# Patient Record
Sex: Female | Born: 1990
Health system: Southern US, Community
[De-identification: ages and names within clinical notes are randomized; demographics above are authoritative.]

## PROBLEM LIST (undated history)

## (undated) DIAGNOSIS — G43909 Migraine, unspecified, not intractable, without status migrainosus: Secondary | ICD-10-CM

## (undated) HISTORY — DX: Migraine, unspecified, not intractable, without status migrainosus: G43.909

## (undated) HISTORY — PX: OTHER SURGICAL HISTORY: SHX169

---

## 2015-10-17 ENCOUNTER — Encounter (HOSPITAL_COMMUNITY): Payer: Self-pay | Admitting: *Deleted

## 2015-10-17 ENCOUNTER — Inpatient Hospital Stay (HOSPITAL_COMMUNITY)
Admission: AD | Admit: 2015-10-17 | Discharge: 2015-10-18 | Disposition: A | Discharge status: Home / Self Care | Payer: Self-pay | Source: Ambulatory Visit | Attending: Obstetrics & Gynecology | Admitting: Obstetrics & Gynecology

## 2015-10-17 DIAGNOSIS — K610 Anal abscess: Secondary | ICD-10-CM | POA: Insufficient documentation

## 2015-10-17 DIAGNOSIS — R300 Dysuria: Secondary | ICD-10-CM | POA: Insufficient documentation

## 2015-10-17 DIAGNOSIS — R102 Pelvic and perineal pain: Secondary | ICD-10-CM | POA: Insufficient documentation

## 2015-10-17 DIAGNOSIS — L0291 Cutaneous abscess, unspecified: Secondary | ICD-10-CM

## 2015-10-17 LAB — POCT PREGNANCY, URINE: Preg Test, Ur: NEGATIVE

## 2015-10-17 LAB — WET PREP, GENITAL
Clue Cells Wet Prep HPF POC: NONE SEEN
Sperm: NONE SEEN
Trich, Wet Prep: NONE SEEN
Yeast Wet Prep HPF POC: NONE SEEN

## 2015-10-17 MED ORDER — LIDOCAINE HCL 2 % EX GEL
1.0000 "application " | Freq: Once | CUTANEOUS | Status: AC
  Administered 2015-10-17: 1 via TOPICAL
  Filled 2015-10-17: qty 5

## 2015-10-17 NOTE — MAU Note (Cosign Needed)
PT SAYS  GOT LAST DEPO SHOT IN 07-2015-     AT [facility]  .   PT  MOVED HERE IN 06-2015.      SAYS SHE HAS A  BOIL ON PERINEUM - NOTICED YESTERDAY- HURTS TO SIT   .    LAST SEX-    3 WEEKS AGO.

## 2015-10-17 NOTE — MAU Provider Note (Signed)
History     CSN: [id number]  Arrival date and time: 10/17/15 2214   First Provider Initiated Contact with Patient 10/17/15 2312      Chief Complaint  Patient presents with  . Bartholin's Cyst   Pelvic Pain The patient's primary symptoms include genital lesions and pelvic pain. The current episode started yesterday. The problem occurs constantly. The problem has been gradually improving (patient states that she feels the cyst is starting to drain. ). Pain severity now: 6/10  The problem affects the left side. She is not pregnant. Associated symptoms include dysuria. Pertinent negatives include no abdominal pain, chills, constipation, diarrhea, fever, frequency, nausea, urgency or vomiting. The symptoms are aggravated by activity. She has tried nothing for the symptoms. She is sexually active. No, her partner does not have an STD. She uses progestin injections (last depo injection in May. Due in July. ) for contraception. Her menstrual history has been irregular (unsure of last date, very seldom period with depo. ).    History reviewed. No pertinent past medical history.  History reviewed. No pertinent past surgical history.  History reviewed. No pertinent family history.  Social History  Substance Use Topics  . Smoking status: Never Smoker   . Smokeless tobacco: None  . Alcohol Use: No    Allergies: Allergies not on file  No prescriptions prior to admission    Review of Systems  Constitutional: Negative for fever and chills.  Gastrointestinal: Negative for nausea, vomiting, abdominal pain, diarrhea and constipation.  Genitourinary: Positive for dysuria and pelvic pain. Negative for urgency and frequency.   Physical Exam   Blood pressure 104/61, pulse 128, temperature 99.5 F (37.5 C), temperature source Oral, resp. rate 18, height  (1.575 m), weight 59.648 kg (131 lb 8 oz).  Physical Exam  Nursing note and vitals reviewed. Constitutional: She is oriented to person,  place, and time. She appears well-developed and well-nourished. No distress.  HENT:  Head: Normocephalic.  Cardiovascular: Normal rate.   Respiratory: Effort normal.  GI: Soft. There is no tenderness. There is no rebound.  Genitourinary:  2cmx2cm perianal abscess. Abscess is open and draining at this time.  Pelvic exam very uncomfortable for the patient. Wet prep and GC/CT swabs collected with blind sweep.   Neurological: She is alert and oriented to person, place, and time.  Skin: Skin is warm and dry.  Psychiatric: She has a normal mood and affect.   Results for orders placed or performed during the hospital encounter of 10/17/15 (from the past 24 hour(s))  Wet prep, genital     Status: Abnormal   Collection Time: 10/17/15 11:24 PM  Result Value Ref Range   Yeast Wet Prep HPF POC NONE SEEN NONE SEEN   Trich, Wet Prep NONE SEEN NONE SEEN   Clue Cells Wet Prep HPF POC NONE SEEN NONE SEEN   WBC, Wet Prep HPF POC MODERATE (A) NONE SEEN   Sperm NONE SEEN   Pregnancy, urine POC     Status: None   Collection Time: 10/17/15 11:48 PM  Result Value Ref Range   Preg Test, Ur NEGATIVE NEGATIVE    MAU Course  Procedures  MDM   Assessment and Plan   1. Abscess    DC home Comfort measures reviewed  RX: none  Return to MAU as needed   Follow-up Information    Follow up with [facility].   Why:  If symptoms worsen   Contact information:   [address] [employer]  [place] [id number] [phone]         Tawnya CrookHogan, Heather Donovan 10/17/2015, 11:14 PM

## 2015-10-18 LAB — URINE MICROSCOPIC-ADD ON: RBC / HPF: NONE SEEN RBC/hpf (ref 0–5)

## 2015-10-18 LAB — URINALYSIS, ROUTINE W REFLEX MICROSCOPIC
Bilirubin Urine: NEGATIVE
Glucose, UA: NEGATIVE mg/dL
Ketones, ur: NEGATIVE mg/dL
Leukocytes, UA: NEGATIVE
Nitrite: NEGATIVE
Protein, ur: NEGATIVE mg/dL
Specific Gravity, Urine: 1.025 (ref 1.005–1.030)
pH: 5.5 (ref 5.0–8.0)

## 2015-10-18 LAB — GC/CHLAMYDIA PROBE AMP (~~LOC~~) NOT AT ARMC
Chlamydia: NEGATIVE
Neisseria Gonorrhea: NEGATIVE

## 2015-10-18 NOTE — Discharge Instructions (Signed)

## 2016-06-05 LAB — HM PAP SMEAR: HM Pap smear: NORMAL

## 2017-05-09 ENCOUNTER — Ambulatory Visit (INDEPENDENT_AMBULATORY_CARE_PROVIDER_SITE_OTHER): Payer: BLUE CROSS/BLUE SHIELD | Admitting: Family Medicine

## 2017-05-09 ENCOUNTER — Encounter: Payer: Self-pay | Admitting: Family Medicine

## 2017-05-09 ENCOUNTER — Other Ambulatory Visit (HOSPITAL_COMMUNITY)
Admission: RE | Admit: 2017-05-09 | Discharge: 2017-05-09 | Disposition: A | Discharge status: Home / Self Care | Payer: BLUE CROSS/BLUE SHIELD | Source: Ambulatory Visit | Attending: Family Medicine | Admitting: Family Medicine

## 2017-05-09 VITALS — BP 98/60 | HR 76 | Temp 98.3°F | Ht 62.75 in | Wt 130.3 lb

## 2017-05-09 DIAGNOSIS — N898 Other specified noninflammatory disorders of vagina: Secondary | ICD-10-CM | POA: Diagnosis not present

## 2017-05-09 DIAGNOSIS — A5602 Chlamydial vulvovaginitis: Secondary | ICD-10-CM | POA: Diagnosis not present

## 2017-05-09 DIAGNOSIS — Z7689 Persons encountering health services in other specified circumstances: Secondary | ICD-10-CM

## 2017-05-09 DIAGNOSIS — B9689 Other specified bacterial agents as the cause of diseases classified elsewhere: Secondary | ICD-10-CM | POA: Insufficient documentation

## 2017-05-09 LAB — POC URINALSYSI DIPSTICK (AUTOMATED)
Blood, UA: NEGATIVE
Glucose, UA: NEGATIVE
Ketones, UA: NEGATIVE
Leukocytes, UA: NEGATIVE
Nitrite, UA: NEGATIVE
Spec Grav, UA: >=1.03 — AB (ref 1.010–1.025)
Urobilinogen, UA: 0.2 E.U./dL
pH, UA: 6 (ref 5.0–8.0)

## 2017-05-09 MED ORDER — METRONIDAZOLE 500 MG PO TABS
500.0000 mg | ORAL_TABLET | Freq: Two times a day (BID) | ORAL | 0 refills | Status: AC
Start: 2017-05-09 — End: 2017-05-16

## 2017-05-09 MED ORDER — FLUCONAZOLE 150 MG PO TABS: 150.0000 mg | ORAL_TABLET | Freq: Once | ORAL | 0 refills | Status: AC

## 2017-05-09 NOTE — Progress Notes (Signed)
Patient presents to clinic today for acute concern and establish care.  SUBJECTIVE: PMH: Pt is a 27 yo with no sig pmh.  She was previously seen by Dr. Tresa ResValerie [place].  [place] the [facility] [place].  Patient reports a history of abnormal Pap, but cannot recall what was abnormal about them. Pt is on Depo.  Next injection due March 2019.   Recurrent yeast infection: -Started a few months ago, happens off and on -Latest episode occurred last week. -Patient endorses itching, irritation, whitish discharge.  Denies burning. -Patient is sexually active with one female partner, her BF.  They are not using condoms.  Is on Depo-Provera -Patient has tried Monistat and Azo.  She reports relief of symptoms today. -Patient does not drink water.  States has been drinking cranberry juice.  Allergies: NKDA  Past surgical history: None  Social history: Patient is currently single.  She works as a Engineer, civil (consulting)nurse at [employer].  Going back to college perhaps at [employer] for a BS in program.  Patient denies tobacco, drug use.  Patient endorses social alcohol use.  LMP September 2018.  Patient is on Depakote.  Family medical history: Mom- alive Sister-alive, hearing loss.   History reviewed. No pertinent past medical history.  History reviewed. No pertinent surgical history.  No current outpatient medications on file prior to visit.   No current facility-administered medications on file prior to visit.     No Known Allergies  History reviewed. No pertinent family history.  Social History   Socioeconomic History  . Marital status: Single    Spouse name: Not on file  . Number of children: Not on file  . Years of education: Not on file  . Highest education level: Not on file  Social Needs  . Financial resource strain: Not on file  . Food insecurity - worry: Not on file  . Food insecurity - inability: Not on file  . Transportation  needs - medical: Not on file  . Transportation needs - non-medical: Not on file  Occupational History  . Not on file  Tobacco Use  . Smoking status: Never Smoker  . Smokeless tobacco: Never Used  Substance and Sexual Activity  . Alcohol use: Yes    Comment: ocass.   . Drug use: No  . Sexual activity: Yes    Birth control/protection: Injection  Other Topics Concern  . Not on file  Social History Narrative  . Not on file    ROS General: Denies fever, chills, night sweats, changes in weight, changes in appetite HEENT: Denies headaches, ear pain, changes in vision, rhinorrhea, sore throat CV: Denies CP, palpitations, SOB, orthopnea Pulm: Denies SOB, cough, wheezing GI: Denies abdominal pain, nausea, vomiting, diarrhea, constipation GU: Denies dysuria, hematuria, frequency,    +vaginal discharge, itching, irritation. Msk: Denies muscle cramps, joint pains Neuro: Denies weakness, numbness, tingling Skin: Denies rashes, bruising Psych: Denies depression, anxiety, hallucinations  BP 98/60 (BP Location: Left Arm, Patient Position: Sitting, Cuff Size: Normal)   Pulse 76   Temp 98.3 F (36.8 C) (Oral)   Ht 5' 2.75" (1.594 m)   Wt 130 lb 4.8 oz (59.1 kg)   BMI 23.27 kg/m   Physical Exam Gen. Pleasant, well developed, well-nourished, in NAD HEENT - [place]/AT, PERRL, no scleral icterus, no nasal drainage. Lungs: no use of accessory muscles, CTAB, no wheezes, rales or rhonchi Cardiovascular: RRR, No r/g/m, no peripheral edema Abdomen: BS present, soft, nontender,nondistended,  no hepatosplenomegaly Musculoskeletal: No deformities, moves all four extremities, no cyanosis or clubbing, normal tone Neuro:  A&Ox3, CN II-XII intact, normal gait Skin:  Warm, dry, intact, no lesions Psych: normal affect, mood appropriate WU:[id number] external female genitalia, no lesions noted.  Thin clear white d/c  No results found for this or any previous visit (from the past 2160  hour(s)).  Assessment/Plan: Vaginal irritation  -Discussed hygiene -Discussed increasing p.o. intake of water -Patient given handout on vaginitis -We will preemptively treat for BV and yeast while awaiting lab results. -UA reviewed, SG elevated, trace protein - Plan: POCT Urinalysis Dipstick (Automated), Urine Culture, Cervicovaginal ancillary only, fluconazole (DIFLUCAN) 150 MG tablet, metroNIDAZOLE (FLAGYL) 500 MG tablet  Encounter to establish care -We reviewed the PMH, PSH, FH, SH, Meds and Allergies. -We provided refills for any medications we will prescribe as needed. -We addressed current concerns per orders and patient instructions. -We have asked for records for pertinent exams, studies, vaccines and notes from previous providers. -We have advised patient to follow up per instructions below.   Follow-up in approximately 1 month for pap.  Follow-up in March 2019 for Depo.   Abbe Amsterdam, MD

## 2017-05-09 NOTE — Patient Instructions (Addendum)
Your homework is to drink at water.  Your goal is four of the 16.9 oz bottles per day.   Vaginitis Vaginitis is an inflammation of the vagina. It can happen when the normal bacteria and yeast in the vagina grow too much. There are different types. Treatment will depend on the type you have. Follow these instructions at home:  Take all medicines as told by your doctor.  Keep your vagina area clean and dry. Avoid soap. Rinse the area with water.  Avoid washing and cleaning out the vagina (douching).  Do not use tampons or have sex (intercourse) until your treatment is done.  Wipe from front to back after going to the restroom.  Wear cotton underwear.  Avoid wearing underwear while you sleep until your vaginitis is gone.  Avoid tight pants. Avoid underwear or nylons without a cotton panel.  Take off wet clothing (such as a bathing suit) as soon as you can.  Use mild, unscented products. Avoid fabric softeners and scented: ? Feminine sprays. ? Laundry detergents. ? Tampons. ? Soaps or bubble baths.  Practice safe sex and use condoms. Get help right away if:  You have belly (abdominal) pain.  You have a fever or lasting symptoms for more than 2-3 days.  You have a fever and your symptoms suddenly get worse. This information is not intended to replace advice given to you by your health care provider. Make sure you discuss any questions you have with your health care provider. Document Released: 07/19/2008 Document Revised: 09/28/2015 Document Reviewed: 10/03/2011 Elsevier Interactive Patient Education  2017 [employer]

## 2017-05-10 LAB — URINE CULTURE
MICRO NUMBER:: 90015207
SPECIMEN QUALITY:: ADEQUATE

## 2017-05-12 LAB — CERVICOVAGINAL ANCILLARY ONLY
Bacterial vaginitis: POSITIVE — AB
Candida vaginitis: NEGATIVE
Chlamydia: POSITIVE — AB
Neisseria Gonorrhea: NEGATIVE
Trichomonas: NEGATIVE

## 2017-05-14 ENCOUNTER — Telehealth: Payer: Self-pay | Admitting: Family Medicine

## 2017-05-14 ENCOUNTER — Other Ambulatory Visit: Payer: Self-pay | Admitting: Family Medicine

## 2017-05-14 DIAGNOSIS — A749 Chlamydial infection, unspecified: Secondary | ICD-10-CM

## 2017-05-14 MED ORDER — AZITHROMYCIN 500 MG PO TABS: 1000.0000 mg | ORAL_TABLET | Freq: Once | ORAL | 0 refills | Status: AC

## 2017-05-14 NOTE — Telephone Encounter (Signed)
Spoke with patient regarding lab results and further questions she had. Nothing further needed.

## 2017-05-14 NOTE — Telephone Encounter (Signed)
Result note from Dr. Salomon FickBanks read to patient. Result [facility] to [facility].

## 2017-05-14 NOTE — Telephone Encounter (Signed)
Copied from CRM [phone]. Topic: Quick Communication - See Telephone Encounter >> May 14, 2017 10:35 AM Clack, Princella PellegriniJessica D wrote: CRM for notification. See Telephone encounter for: Pt would like Banks to give her a call, states that she has some questions about her labs.  She did not want to give me the question, would just like a call back.  05/14/17.

## 2017-05-14 NOTE — Progress Notes (Signed)
Azithromycin 1 gram, 0 refills sent to pharmacy for chlamydia infection.

## 2017-05-21 ENCOUNTER — Telehealth: Payer: Self-pay | Admitting: Emergency Medicine

## 2017-05-21 NOTE — Telephone Encounter (Signed)
Spoke with patient and she states that she is still having vaginal irritation, no odor, slight discharge. Patient did finish medication that was prescribed. Patient just wants to make sure that everything is okay. Patient has been scheduled to come in and se PCP 05/23/2017 at 9:30am. Nothing further needed.

## 2017-05-23 ENCOUNTER — Encounter: Payer: Self-pay | Admitting: Family Medicine

## 2017-05-23 ENCOUNTER — Ambulatory Visit: Payer: BLUE CROSS/BLUE SHIELD | Admitting: Family Medicine

## 2017-05-23 VITALS — BP 104/70 | HR 74 | Temp 98.2°F | Wt 130.2 lb

## 2017-05-23 DIAGNOSIS — A749 Chlamydial infection, unspecified: Secondary | ICD-10-CM | POA: Diagnosis not present

## 2017-05-23 NOTE — Progress Notes (Signed)
Subjective:    Patient ID: Kathy Cain, female    DOB: 06-Dec-1990, 27 y.o.   MRN: [id number]  No chief complaint on file.   HPI Patient was seen today for f/u on recent labs.  Pt tested positive for Chlamydia.  She completed her Azithromycin 1 g.  Pt endorses some continued d/c but not as much as before and occasional irritation.  Pt states she told her parnter, who has also been treated.  Pt states she was with him on and off for 10 yrs, but thinks she just wants time to herself given the recent events.  No past medical history on file.  No Known Allergies  ROS General: Denies fever, chills, night sweats, changes in weight, changes in appetite HEENT: Denies headaches, ear pain, changes in vision, rhinorrhea, sore throat CV: Denies CP, palpitations, SOB, orthopnea Pulm: Denies SOB, cough, wheezing GI: Denies abdominal pain, nausea, vomiting, diarrhea, constipation GU: Denies dysuria, hematuria, frequency, vaginal discharge and mild irritation Msk: Denies muscle cramps, joint pains Neuro: Denies weakness, numbness, tingling Skin: Denies rashes, bruising Psych: Denies depression, anxiety, hallucinations     Objective:    Blood pressure 104/70, pulse 74, temperature 98.2 F (36.8 C), temperature source Oral, weight 130 lb 3.2 oz (59.1 kg).   Gen. Pleasant, well-nourished, in no distress, normal affect   HEENT: [place]/AT, face symmetric, conjunctiva clear, no scleral icterus, PERRLA, nares patent without drainage Lungs: no accessory muscle use, CTAB, no wheezes or rales Cardiovascular: RRR, no m/r/g, no peripheral edema Abdomen: BS present, soft, NT/ND Neuro:  A&Ox3, CN II-XII intact, normal gait   Wt Readings from Last 3 Encounters:  05/23/17 130 lb 3.2 oz (59.1 kg)  05/09/17 130 lb 4.8 oz (59.1 kg)  10/17/15 131 lb 8 oz (59.6 kg)    No results found for: WBC, HGB, HCT, PLT, GLUCOSE, CHOL, TRIG, HDL, LDLDIRECT, LDLCALC, ALT, AST, NA, K, CL, CREATININE, BUN, CO2, TSH, PSA, INR,  GLUF, HGBA1C, MICROALBUR  Assessment/Plan:  Chlamydia -pt treated on 05/14/17 with Azithromycin 1 G. -pt advised to safe sex practices including condom use. -Pt has notified her partner who has also been treated. -Advised can retest or do test of cure no sooner than  3 wks after original test date. -given handout. -f/u prn.   Abbe AmsterdamShannon Banks, MD

## 2017-05-23 NOTE — Patient Instructions (Addendum)
Chlamydia, Female Chlamydia is an STD (sexually transmitted disease). It is a bacterial infection that spreads (is contagious) through sexual contact. Chlamydia can occur in different areas of the body, including:  The tube that moves urine from the bladder out of the body (urethra).  The lower part of the uterus (cervix).  The throat.  The rectum.  This condition is not difficult to treat. However, if left untreated, chlamydia can lead to more serious health problems, including pelvic inflammatory disorder (PID). PID can increase your risk of not being able to have children (sterility). What are the causes? Chlamydia is caused by the bacteria Chlamydia trachomatis. It is passed from an infected partner during sexual activity. Chlamydia can spread through contact with the genitals, mouth, or rectum. What are the signs or symptoms? In some cases, there may not be any symptoms for this condition (asymptomatic), especially early in the infection. If symptoms develop, they may include:  Burning with urination.  Frequent urination.  Vaginal discharge.  Redness, soreness, and swelling (inflammation) of the rectum.  Bleeding or discharge from the rectum.  Abdominal pain.  Pain during sexual intercourse.  Bleeding between menstrual periods.  Itching, burning, or redness in the eyes, or discharge from the eyes.  How is this diagnosed? This condition may be diagnosed with:  Urine tests.  Swab tests. Depending on your symptoms, your health care provider may use a cotton swab to collect discharge from your vagina or rectum to test for the bacteria.  A pelvic exam.  How is this treated? This condition is treated with antibiotic medicines. If you are pregnant, certain types of antibiotics will need to be avoided. Follow these instructions at home: Medicines  Take over-the-counter and prescription medicines only as told by your health care provider.  Take your antibiotic medicine  as told by your health care provider. Do not stop taking the antibiotic even if you start to feel better. Sexual activity  Tell sexual partners about your infection. This includes any oral, anal, or vaginal sex partners you have had within 60 days of when your symptoms started. Sexual partners should also be treated, even if they have no signs of the disease.  Do not have sex until you and your sexual partners have completed treatment and your health care provider says it is okay. If your health care provider prescribed you a single dose treatment, wait 7 days after taking the treatment before having sex. General instructions  It is your responsibility to get your test results. Ask your health care provider, or the department performing the test, when your results will be ready.  Get plenty of rest.  Eat a healthy, well-balanced diet.  Drink enough fluids to keep your urine clear or pale yellow.  Keep all follow-up visits as told by your health care provider. This is important. You may need to be tested for infection again 3 months after treatment. How is this prevented? The only sure way to prevent chlamydia is to avoid having sex. However, you can lower your risk by:  Using latex condoms correctly every time you have sex.  Not having multiple sexual partners.  Asking if your sexual partner has been tested for STIs and had negative results.  Contact a health care provider if:  You develop new symptoms or your symptoms do not get better after completing treatment.  You have a fever or chills.  You have pain during sexual intercourse. Get help right away if:  Your pain gets worse and does   not get better with medicine.  You develop flu-like symptoms, such as night sweats, sore throat, or muscle aches.  You experience nausea or vomiting.  You have difficulty swallowing.  You have bleeding between periods or after sex.  You have irregular menstrual periods.  You have  abdominal or lower back pain that does not get better with medicine.  You feel weak or dizzy, or you faint.  You are pregnant and you develop symptoms of chlamydia. Summary  Chlamydia is an STD (sexually transmitted disease). It is a bacterial infection that spreads (is contagious) through sexual contact.  This condition is not difficult to treat, however. If left untreated, chlamydia can lead to more serious health problems, including pelvic inflammatory disease (PID).  In some cases, there may not be any symptoms for this condition (asymptomatic).  This condition is treated with antibiotic medicines.  Using latex condoms correctly every time you have sex can help prevent chlamydia. This information is not intended to replace advice given to you by your health care provider. Make sure you discuss any questions you have with your health care provider. Document Released: 01/30/2005 Document Revised: 04/08/2016 Document Reviewed: 04/08/2016 Elsevier Interactive Patient Education  2018 Elsevier Inc.  

## 2017-05-29 ENCOUNTER — Encounter: Payer: Self-pay | Admitting: Family Medicine

## 2017-07-15 ENCOUNTER — Ambulatory Visit: Payer: BLUE CROSS/BLUE SHIELD

## 2017-07-25 ENCOUNTER — Ambulatory Visit (INDEPENDENT_AMBULATORY_CARE_PROVIDER_SITE_OTHER): Payer: BLUE CROSS/BLUE SHIELD

## 2017-07-25 DIAGNOSIS — N912 Amenorrhea, unspecified: Secondary | ICD-10-CM | POA: Diagnosis not present

## 2017-07-25 DIAGNOSIS — Z308 Encounter for other contraceptive management: Secondary | ICD-10-CM

## 2017-07-25 LAB — POCT URINE PREGNANCY: Preg Test, Ur: NEGATIVE

## 2017-07-25 MED ORDER — MEDROXYPROGESTERONE ACETATE 150 MG/ML IM SUSP
150.0000 mg | Freq: Once | INTRAMUSCULAR | Status: AC
Start: 2017-07-25 — End: 2017-07-25
  Administered 2017-07-25: 150 mg via INTRAMUSCULAR

## 2017-07-25 NOTE — Progress Notes (Signed)
Per orders of Dr. Salomon FickBanks, injection of Medroxyprogesterone 150 mg given by Carola RhineNancy N [place]. Patient tolerated injection well.

## 2017-07-25 NOTE — Addendum Note (Signed)
Addended by: Carola RhineKIGOTHO, NANCY N on: 07/25/2017 02:13 PM   Modules accepted: Orders

## 2017-08-28 ENCOUNTER — Ambulatory Visit (INDEPENDENT_AMBULATORY_CARE_PROVIDER_SITE_OTHER): Payer: BLUE CROSS/BLUE SHIELD | Admitting: Family Medicine

## 2017-08-28 ENCOUNTER — Other Ambulatory Visit (HOSPITAL_COMMUNITY)
Admission: RE | Admit: 2017-08-28 | Discharge: 2017-08-28 | Disposition: A | Discharge status: Home / Self Care | Payer: BLUE CROSS/BLUE SHIELD | Source: Ambulatory Visit | Attending: Family Medicine | Admitting: Family Medicine

## 2017-08-28 ENCOUNTER — Encounter: Payer: Self-pay | Admitting: Family Medicine

## 2017-08-28 VITALS — BP 122/70 | HR 97 | Ht 63.0 in | Wt 135.0 lb

## 2017-08-28 DIAGNOSIS — Z124 Encounter for screening for malignant neoplasm of cervix: Secondary | ICD-10-CM

## 2017-08-28 DIAGNOSIS — Z0000 Encounter for general adult medical examination without abnormal findings: Secondary | ICD-10-CM

## 2017-08-28 DIAGNOSIS — Z13220 Encounter for screening for lipoid disorders: Secondary | ICD-10-CM

## 2017-08-28 DIAGNOSIS — Z113 Encounter for screening for infections with a predominantly sexual mode of transmission: Secondary | ICD-10-CM | POA: Diagnosis not present

## 2017-08-28 DIAGNOSIS — R5383 Other fatigue: Secondary | ICD-10-CM | POA: Diagnosis not present

## 2017-08-28 LAB — CBC
HCT: 35.4 % — ABNORMAL LOW (ref 36.0–46.0)
Hemoglobin: 11.9 g/dL — ABNORMAL LOW (ref 12.0–15.0)
MCHC: 33.6 g/dL (ref 30.0–36.0)
MCV: 83.4 fl (ref 78.0–100.0)
Platelets: 310 10*3/uL (ref 150.0–400.0)
RBC: 4.25 Mil/uL (ref 3.87–5.11)
RDW: 13.6 % (ref 11.5–15.5)
WBC: 4.7 10*3/uL (ref 4.0–10.5)

## 2017-08-28 LAB — BASIC METABOLIC PANEL
BUN: 21 mg/dL (ref 6–23)
CO2: 26 mEq/L (ref 19–32)
Calcium: 8.9 mg/dL (ref 8.4–10.5)
Chloride: 105 mEq/L (ref 96–112)
Creatinine, Ser: 0.95 mg/dL (ref 0.40–1.20)
GFR: 91.62 mL/min (ref >60.00)
Glucose, Bld: 90 mg/dL (ref 70–99)
Potassium: 3.7 mEq/L (ref 3.5–5.1)
Sodium: 138 mEq/L (ref 135–145)

## 2017-08-28 LAB — TSH: TSH: 3.12 u[IU]/mL (ref 0.35–4.50)

## 2017-08-28 LAB — VITAMIN D 25 HYDROXY (VIT D DEFICIENCY, FRACTURES): VITD: 7.55 ng/mL — ABNORMAL LOW (ref 30.00–100.00)

## 2017-08-28 LAB — T4, FREE: Free T4: 0.76 ng/dL (ref 0.60–1.60)

## 2017-08-28 NOTE — Patient Instructions (Signed)
Preventive Care 18-39 Years, Female Preventive care refers to lifestyle choices and visits with your health care provider that can promote health and wellness. What does preventive care include?  A yearly physical exam. This is also called an annual well check.  Dental exams once or twice a year.  Routine eye exams. Ask your health care provider how often you should have your eyes checked.  Personal lifestyle choices, including: ? Daily care of your teeth and gums. ? Regular physical activity. ? Eating a healthy diet. ? Avoiding tobacco and drug use. ? Limiting alcohol use. ? Practicing safe sex. ? Taking vitamin and mineral supplements as recommended by your health care provider. What happens during an annual well check? The services and screenings done by your health care provider during your annual well check will depend on your age, overall health, lifestyle risk factors, and family history of disease. Counseling Your health care provider may ask you questions about your:  Alcohol use.  Tobacco use.  Drug use.  Emotional well-being.  Home and relationship well-being.  Sexual activity.  Eating habits.  Work and work Statistician.  Method of birth control.  Menstrual cycle.  Pregnancy history.  Screening You may have the following tests or measurements:  Height, weight, and BMI.  Diabetes screening. This is done by checking your blood sugar (glucose) after you have not eaten for a while (fasting).  Blood pressure.  Lipid and cholesterol levels. These may be checked every 5 years starting at age 66.  Skin check.  Hepatitis C blood test.  Hepatitis B blood test.  Sexually transmitted disease (STD) testing.  BRCA-related cancer screening. This may be done if you have a family history of breast, ovarian, tubal, or peritoneal cancers.  Pelvic exam and Pap test. This may be done every 3 years starting at age 40. Starting at age 59, this may be done every 5  years if you have a Pap test in combination with an HPV test.  Discuss your test results, treatment options, and if necessary, the need for more tests with your health care provider. Vaccines Your health care provider may recommend certain vaccines, such as:  Influenza vaccine. This is recommended every year.  Tetanus, diphtheria, and acellular pertussis (Tdap, Td) vaccine. You may need a Td booster every 10 years.  Varicella vaccine. You may need this if you have not been vaccinated.  HPV vaccine. If you are 69 or younger, you may need three doses over 6 months.  Measles, mumps, and rubella (MMR) vaccine. You may need at least one dose of MMR. You may also need a second dose.  Pneumococcal 13-valent conjugate (PCV13) vaccine. You may need this if you have certain conditions and were not previously vaccinated.  Pneumococcal polysaccharide (PPSV23) vaccine. You may need one or two doses if you smoke cigarettes or if you have certain conditions.  Meningococcal vaccine. One dose is recommended if you are age 27-21 years and a first-year college student living in a residence hall, or if you have one of several medical conditions. You may also need additional booster doses.  Hepatitis A vaccine. You may need this if you have certain conditions or if you travel or work in places where you may be exposed to hepatitis A.  Hepatitis B vaccine. You may need this if you have certain conditions or if you travel or work in places where you may be exposed to hepatitis B.  Haemophilus influenzae type b (Hib) vaccine. You may need this if  you have certain risk factors.  Talk to your health care provider about which screenings and vaccines you need and how often you need them. This information is not intended to replace advice given to you by your health care provider. Make sure you discuss any questions you have with your health care provider. Document Released: 06/18/2001 Document Revised: 01/10/2016  Document Reviewed: 02/21/2015 Elsevier Interactive Patient Education  [employer].

## 2017-08-28 NOTE — Addendum Note (Signed)
Addended by: Deeann SaintBANKS, SHANNON R on: 08/28/2017 09:48 AM   Modules accepted: Orders

## 2017-08-28 NOTE — Progress Notes (Signed)
Subjective:     Kathy Cain is a 27 y.o. female and is here for a comprehensive physical exam. The patient reports no problems.  Pt states in the past she has had some abnormal paps, but does not recall how they were abnormal.  Patient does endorse drinking more water since last OFV and refraining from sexual activity.  Pt also notes she feels tired all the time.  Patient states she wakes up and feels like she could go back to sleep. Pt does note a h/o anemia.    Social History   Socioeconomic History  . Marital status: Single    Spouse name: Not on file  . Number of children: Not on file  . Years of education: Not on file  . Highest education level: Not on file  Occupational History  . Not on file  Social Needs  . Financial resource strain: Not on file  . Food insecurity:    Worry: Not on file    Inability: Not on file  . Transportation needs:    Medical: Not on file    Non-medical: Not on file  Tobacco Use  . Smoking status: Never Smoker  . Smokeless tobacco: Never Used  Substance and Sexual Activity  . Alcohol use: Yes    Comment: ocass.   . Drug use: No  . Sexual activity: Yes    Birth control/protection: Injection  Lifestyle  . Physical activity:    Days per week: Not on file    Minutes per session: Not on file  . Stress: Not on file  Relationships  . Social connections:    Talks on phone: Not on file    Gets together: Not on file    Attends religious service: Not on file    Active member of club or organization: Not on file    Attends meetings of clubs or organizations: Not on file    Relationship status: Not on file  . Intimate partner violence:    Fear of current or ex partner: Not on file    Emotionally abused: Not on file    Physically abused: Not on file    Forced sexual activity: Not on file  Other Topics Concern  . Not on file  Social History Narrative  . Not on file   Health Maintenance  Topic Date Due  . HIV Screening  11/25/2006   . TETANUS/TDAP  11/25/2010  . INFLUENZA VACCINE  01/09/2018 (Originally 12/04/2017)  . PAP SMEAR  06/06/2019    The following portions of the patient's history were reviewed and updated as appropriate: allergies, current medications, past family history, past medical history, past social history, past surgical history and problem list.  Review of Systems A comprehensive review of systems was negative.   Objective:    BP 122/70 (BP Location: Left Arm, Patient Position: Sitting, Cuff Size: Normal)   Pulse 97   Ht 5\' 3"  (1.6 m)   Wt 135 lb (61.2 kg)   SpO2 98%   BMI 23.91 kg/m  General appearance: alert, cooperative, appears stated age and no distress Head: Normocephalic, without obvious abnormality, atraumatic Eyes: conjunctivae/corneas clear. PERRL, EOM's intact. Fundi benign. Ears: normal TM's and external ear canals both ears Nose: Nares normal. Septum midline. Mucosa normal. No drainage or sinus tenderness. Throat: lips, mucosa, and tongue normal; teeth and gums normal Neck: no adenopathy, no JVD, supple, symmetrical, trachea midline and thyroid not enlarged, symmetric, no tenderness/mass/nodules Lungs: clear to auscultation bilaterally Heart: regular rate and rhythm,  S1, S2 normal, no murmur, click, rub or gallop Abdomen: soft, non-tender; bowel sounds normal; no masses,  no organomegaly Pelvic: cervix normal in appearance, external genitalia normal, no adnexal masses or tenderness, no cervical motion tenderness, rectovaginal septum normal, uterus normal size, shape, and consistency and vagina normal without discharge Extremities: extremities normal, atraumatic, no cyanosis or edema Skin: Skin color, texture, turgor normal. No rashes or lesions Neurologic: Alert and oriented X 3, normal strength and tone. Normal symmetric reflexes. Normal coordination and gait    Assessment:    Healthy female exam. With concern for being tired.     Plan:      Anticipatory guidance given  including wearing seatbelts, home, increasing physical activity, increasing p.o. intake of water. Continue depo provera. See After Visit Summary for Counseling Recommendations   Next CPE in 1 year  Routine screening -We will test for HIV and syphilis  Lethargy We will check TSH, T4, and vitamin D  Follow-up PRN  Abbe AmsterdamShannon Banks, MD

## 2017-08-29 ENCOUNTER — Other Ambulatory Visit: Payer: Self-pay | Admitting: Family Medicine

## 2017-08-29 DIAGNOSIS — E559 Vitamin D deficiency, unspecified: Secondary | ICD-10-CM

## 2017-08-29 LAB — RPR: RPR Ser Ql: NONREACTIVE

## 2017-08-29 LAB — HIV ANTIBODY (ROUTINE TESTING W REFLEX): HIV 1&2 Ab, 4th Generation: NONREACTIVE

## 2017-08-29 MED ORDER — VITAMIN D (ERGOCALCIFEROL) 1.25 MG (50000 UNIT) PO CAPS: 50000.0000 [IU] | ORAL_CAPSULE | ORAL | 0 refills | Status: DC

## 2017-09-01 LAB — CYTOLOGY - PAP
Bacterial vaginitis: NEGATIVE
Chlamydia: NEGATIVE
Diagnosis: NEGATIVE
Neisseria Gonorrhea: NEGATIVE
Trichomonas: NEGATIVE

## 2017-10-10 ENCOUNTER — Ambulatory Visit (INDEPENDENT_AMBULATORY_CARE_PROVIDER_SITE_OTHER): Payer: BLUE CROSS/BLUE SHIELD | Admitting: *Deleted

## 2017-10-10 DIAGNOSIS — Z308 Encounter for other contraceptive management: Secondary | ICD-10-CM

## 2017-10-10 MED ORDER — MEDROXYPROGESTERONE ACETATE 150 MG/ML IM SUSP
150.0000 mg | Freq: Once | INTRAMUSCULAR | Status: AC
  Administered 2017-10-10: 150 mg via INTRAMUSCULAR

## 2017-10-10 NOTE — Progress Notes (Signed)
Per orders of Dr. Salomon FickBanks, injection of Depo-Provera given by Starla Linkarolyn J Oakley. Patient tolerated injection well.  Patient instructed to return for next injection between August 23-Sept 6.

## 2018-01-02 ENCOUNTER — Ambulatory Visit: Payer: BLUE CROSS/BLUE SHIELD | Admitting: Family Medicine

## 2018-01-07 ENCOUNTER — Encounter: Payer: Self-pay | Admitting: Family Medicine

## 2018-01-07 ENCOUNTER — Ambulatory Visit (INDEPENDENT_AMBULATORY_CARE_PROVIDER_SITE_OTHER): Payer: BLUE CROSS/BLUE SHIELD | Admitting: Family Medicine

## 2018-01-07 VITALS — BP 100/80 | HR 88 | Temp 98.5°F | Wt 143.0 lb

## 2018-01-07 DIAGNOSIS — E559 Vitamin D deficiency, unspecified: Secondary | ICD-10-CM

## 2018-01-07 DIAGNOSIS — L290 Pruritus ani: Secondary | ICD-10-CM

## 2018-01-07 DIAGNOSIS — Z308 Encounter for other contraceptive management: Secondary | ICD-10-CM

## 2018-01-07 DIAGNOSIS — L299 Pruritus, unspecified: Secondary | ICD-10-CM

## 2018-01-07 MED ORDER — MEDROXYPROGESTERONE ACETATE 150 MG/ML IM SUSP
150.0000 mg | Freq: Once | INTRAMUSCULAR | Status: AC
  Administered 2018-01-07: 150 mg via INTRAMUSCULAR

## 2018-01-07 MED ORDER — FLUCONAZOLE 150 MG PO TABS: 150.0000 mg | ORAL_TABLET | Freq: Once | ORAL | 0 refills | Status: AC

## 2018-01-07 MED ORDER — CETIRIZINE HCL 10 MG PO TABS: 10.0000 mg | ORAL_TABLET | Freq: Every day | ORAL | 2 refills | Status: DC

## 2018-01-07 MED ORDER — ZINC OXIDE 6 % EX CREA: 1.0000 "application " | TOPICAL_CREAM | Freq: Two times a day (BID) | CUTANEOUS | 0 refills | Status: DC | PRN

## 2018-01-07 MED ORDER — HYDROCORTISONE 2.5 % EX CREA: TOPICAL_CREAM | Freq: Two times a day (BID) | CUTANEOUS | 0 refills | Status: AC

## 2018-01-07 NOTE — Patient Instructions (Signed)
Pruritus Pruritus is an itching feeling. There are many different conditions and factors that can make your skin itchy. Dry skin is one of the most common causes of itching. Most cases of itching do not require medical attention. Itchy skin can turn into a rash. Follow these instructions at home: Watch your pruritus for any changes. Take these steps to help with your condition: Skin Care  Moisturize your skin as needed. A moisturizer that contains petroleum jelly is best for keeping moisture in your skin.  Take or apply medicines only as directed by your health care provider. This may include: ? Corticosteroid cream. ? Anti-itch lotions. ? Oral anti-histamines.  Apply cool compresses to the affected areas.  Try taking a bath with: ? Epsom salts. Follow the instructions on the packaging. You can get these at your local pharmacy or grocery store. ? Baking soda. Pour a small amount into the bath as directed by your health care provider. ? Colloidal oatmeal. Follow the instructions on the packaging. You can get this at your local pharmacy or grocery store.  Try applying baking soda paste to your skin. Stir water into baking soda until it reaches a paste-like consistency.  Do not scratch your skin.  Avoid hot showers or baths, which can make itching worse. A cold shower may help with itching as long as you use a moisturizer after.  Avoid scented soaps, detergents, and perfumes. Use gentle soaps, detergents, perfumes, and other cosmetic products. General instructions  Avoid wearing tight clothes.  Keep a journal to help track what causes your itch. Write down: ? What you eat. ? What cosmetic products you use. ? What you drink. ? What you wear. This includes jewelry.  Use a humidifier. This keeps the air moist, which helps to prevent dry skin. Contact a health care provider if:  The itching does not go away after several days.  You sweat at night.  You have weight loss.  You  are unusually thirsty.  You urinate more than normal.  You are more tired than normal.  You have abdominal pain.  Your skin tingles.  You feel weak.  Your skin or the whites of your eyes look yellow (jaundice).  Your skin feels numb. This information is not intended to replace advice given to you by your health care provider. Make sure you discuss any questions you have with your health care provider. Document Released: 01/02/2011 Document Revised: 09/28/2015 Document Reviewed: 04/18/2014 Elsevier Interactive Patient Education  2018 [employer].  Anal Pruritus Anal pruritus is an itchy feeling in the anus and the skin in the anal area. This is common and can be caused by many things. It often occurs when the area becomes moist. Moisture may be due to sweating or a small amount of stool (feces) that is left on the area because of poor personal cleaning. Some other causes include:  Perfumed soaps and sprays.  Colored toilet paper.  Chemicals in the foods that you eat.  Dietary factors, such as caffeine, beer, milk products, chocolate, nuts, citrus fruits, tomatoes, spicy seasonings, jalapeno peppers, and salsa.  Hemorrhoids, fissures, infections, and other anal diseases.  Excessive washing.  Overuse of laxatives.  Skin disorders (psoriasis, eczema, or seborrhea).  Some medical disorders, such as diabetes or thyroid problems.  Diarrhea.  STDs (sexually transmitted diseases).  Some cancers.  In many cases, the cause is not known. The itching usually goes away with treatment and home care. Scratching can cause further skin damage. Follow these instructions  at home: Pay attention to any changes in your symptoms. Take these actions to help with your itching: Skin Care  Practice good hygiene. ? Clean the anal area gently with wet toilet paper, baby wipes, or a wet washcloth after every bowel movement and at bedtime. ? Avoid using soaps on the anal area. ? Dry the area  thoroughly. Pat the area dry with toilet paper or a towel.  Do not scrub the anal area with anything, including toilet paper.  Do not scratch the itchy area. Scratching produces more damage and makes the itching worse.  Take sitz baths in warm water as told by your health care provider. Pat the area dry with a soft cloth after each bath.  Use creams or ointments as told by your health care provider. Zinc oxide ointment or a moisture barrier cream can be applied several times per day to protect the skin.  Do not use anything that irritates the skin, such as bubble baths, scented toilet paper, or genital deodorants. General instructions  Take over-the-counter and prescription medicines only as told by your health care provider.  Talk with your health care provider about fiber supplements. These are helpful in keeping your stool normal if you have frequent loose stools.  Wear cotton underwear and loose clothing.  Keep all follow-up visits as told by your health care provider. This is important. Contact a health care provider if:  Your itching does not improve in several days.  Your itching gets worse.  You have a fever.  You have redness, swelling, or pain in the anal area.  You have fluid, blood, or pus coming from the anal area. This information is not intended to replace advice given to you by your health care provider. Make sure you discuss any questions you have with your health care provider. Document Released: 10/22/2010 Document Revised: 09/28/2015 Document Reviewed: 07/18/2014 Elsevier Interactive Patient Education  [employer].

## 2018-01-07 NOTE — Progress Notes (Signed)
Subjective:    Patient ID: Kathy Cain, female    DOB: 1990/08/23, 27 y.o.   MRN: [id number]  No chief complaint on file.   HPI Patient was seen today for f/u.  Pt endorses continued anal pruritus x months.  Now with pruritus bilateral axilla and breasts.  Pt denies rash, irritation, changes in soaps, changes in lotions, changes in detergents, changes in foods.  Pt using Dove body wash. She stopped getting bikini waxes thinking it was causing issues/hair bumps. She has tried a cream with no relief.  Pt does shave under her arms.  Pt also wanting to recheck Vit D level.  Completed wkly ergocalciferol 50,000 IU.  Did not know the difference with taking the medication.  Pt also here for Depo.  History reviewed. No pertinent past medical history.  No Known Allergies  ROS General: Denies fever, chills, night sweats, changes in weight, changes in appetite HEENT: Denies headaches, ear pain, changes in vision, rhinorrhea, sore throat CV: Denies CP, palpitations, SOB, orthopnea Pulm: Denies SOB, cough, wheezing GI: Denies abdominal pain, nausea, vomiting, diarrhea, constipation GU: Denies dysuria, hematuria, frequency, vaginal discharge Msk: Denies muscle cramps, joint pains Neuro: Denies weakness, numbness, tingling Skin: Denies rashes, bruising  +itching of anus, breast and axilla Psych: Denies depression, anxiety, hallucinations     Objective:    Blood pressure 100/80, pulse 88, temperature 98.5 F (36.9 C), temperature source Oral, weight 143 lb (64.9 kg), SpO2 98 %.   Gen. Pleasant, well-nourished, in no distress, normal affect   HEENT: [place]/AT, face symmetric, no scleral icterus, PERRLA, nares patent without drainage. Lungs: no accessory muscle use, CTAB, no wheezes or rales Cardiovascular: RRR, no peripheral edema Abdomen: BS present, soft, NT/ND Neuro:  A&Ox3, CN II-XII intact, normal gait Skin:  Warm, no lesions/ rash   Wt Readings from Last 3 Encounters:   01/07/18 143 lb (64.9 kg)  08/28/17 135 lb (61.2 kg)  05/23/17 130 lb 3.2 oz (59.1 kg)    Lab Results  Component Value Date   WBC 4.7 08/28/2017   HGB 11.9 (L) 08/28/2017   HCT 35.4 (L) 08/28/2017   PLT 310.0 08/28/2017   GLUCOSE 90 08/28/2017   NA 138 08/28/2017   K 3.7 08/28/2017   CL 105 08/28/2017   CREATININE 0.95 08/28/2017   BUN 21 08/28/2017   CO2 26 08/28/2017   TSH 3.12 08/28/2017    Assessment/Plan:  Vitamin D deficiency  -vit d was 7.55 on 08/28/17 -Ergocalciferol 50,000 IU x 12 weeks completed - Plan: Vitamin D, 25-hydroxy  Anal pruritus  -discussed scotch tape test if itching continues.  Pt skeptical about this. - Plan: fluconazole (DIFLUCAN) 150 MG tablet, cetirizine (ZYRTEC) 10 MG tablet, hydrocortisone 2.5 % cream -Can also consider zinc oxide or treatment for parasite.  Itching  -given handout -discussed various causes including allergies (to dye, chemicals, or foods), hard water, parasite, fungus, etc -pt to try unscented body wash/lotion for sensitive skin. - Plan: cetirizine (ZYRTEC) 10 MG tablet  Encounter for other contraceptive management  -within window - Plan: medroxyPROGESTERone (DEPO-PROVERA) injection 150 mg  Patient to contact office if anal itching continues.  Will then consider treatment for parasite.  Follow-up in the next few weeks  Abbe Amsterdam, MD

## 2018-01-08 ENCOUNTER — Telehealth: Payer: Self-pay | Admitting: Family Medicine

## 2018-01-08 ENCOUNTER — Other Ambulatory Visit: Payer: Self-pay | Admitting: Family Medicine

## 2018-01-08 DIAGNOSIS — E559 Vitamin D deficiency, unspecified: Secondary | ICD-10-CM

## 2018-01-08 LAB — VITAMIN D 25 HYDROXY (VIT D DEFICIENCY, FRACTURES): VITD: 18 ng/mL — ABNORMAL LOW (ref 30.00–100.00)

## 2018-01-08 MED ORDER — VITAMIN D (ERGOCALCIFEROL) 1.25 MG (50000 UNIT) PO CAPS: 50000.0000 [IU] | ORAL_CAPSULE | ORAL | 0 refills | Status: DC

## 2018-01-08 NOTE — Telephone Encounter (Signed)
Copied from CRM [id number]. Topic: Quick Communication - See Telephone Encounter >> Jan 08, 2018 12:30 PM Arlyss Gandy, NT wrote: CRM for notification. See Telephone encounter for: 01/08/18. Pt requesting Dr. Salomon Fick to call her regarding medication that was prescribed yesterday. She states it did not work. No further details were given. Stated she would like to speak with her doctor.

## 2018-01-09 NOTE — Telephone Encounter (Signed)
Patient called back and stated no one returned her call as she is not better.  Message sent to [facility] to address as Dr Salomon Fick is out of the office.

## 2018-01-09 NOTE — Telephone Encounter (Signed)
I called the pt and informed her of the message below

## 2018-01-09 NOTE — Telephone Encounter (Signed)
Recommend OTC zinc Oxide. It may take more than 2 days to clear up.

## 2018-01-13 ENCOUNTER — Telehealth: Payer: Self-pay | Admitting: Family Medicine

## 2018-01-13 NOTE — Telephone Encounter (Signed)
Copied from CRM [id number]. Topic: Quick Communication - See Telephone Encounter >> Jan 08, 2018 12:30 PM Arlyss Gandy, NT wrote: CRM for notification. See Telephone encounter for: 01/08/18. Pt requesting Dr. Salomon Fick to call her regarding medication that was prescribed yesterday. She states it did not work. No further details were given. Stated she would like to speak with her doctor. >> Jan 13, 2018  3:40 PM Percival Spanish wrote:  Pt called back to follow up on her req for a call back

## 2018-01-15 NOTE — Telephone Encounter (Signed)
Spoke with pt voiced understanding that a referral to a dermatology has been placed

## 2018-01-27 ENCOUNTER — Telehealth: Payer: Self-pay | Admitting: Family Medicine

## 2018-01-27 NOTE — Telephone Encounter (Signed)
Copied from CRM [phone]. Topic: Referral - Status >> Jan 27, 2018 12:39 PM Baldo DaubAlexander, Amber L wrote: Reason for CRM:   Pt states that Dr. Salomon FickBanks was supposed to be referring her to a dermatologist, but she hasn't heard anything about that. Pt can be reached at [phone]

## 2018-01-28 NOTE — Telephone Encounter (Signed)
Ok to refer to derm?

## 2018-01-28 NOTE — Telephone Encounter (Signed)
Okay to refer? 

## 2018-01-29 ENCOUNTER — Other Ambulatory Visit: Payer: Self-pay | Admitting: Family Medicine

## 2018-01-29 DIAGNOSIS — L299 Pruritus, unspecified: Secondary | ICD-10-CM

## 2018-01-29 NOTE — Telephone Encounter (Signed)
Referral has been placed. 

## 2018-03-14 DIAGNOSIS — L739 Follicular disorder, unspecified: Secondary | ICD-10-CM | POA: Diagnosis not present

## 2018-03-14 DIAGNOSIS — L299 Pruritus, unspecified: Secondary | ICD-10-CM | POA: Diagnosis not present

## 2018-03-26 ENCOUNTER — Ambulatory Visit: Payer: Self-pay | Admitting: *Deleted

## 2018-03-26 MED ORDER — FLUCONAZOLE 150 MG PO TABS: 150.0000 mg | ORAL_TABLET | Freq: Once | ORAL | 1 refills | Status: AC

## 2018-03-26 NOTE — Telephone Encounter (Signed)
Pt called stating that she has a yeast infection Symptoms just starting today with itching and some minor discharge of clear liquid. No odor. Pt states she was started on Doxycycline by her dermatologist and she now has symptoms. She states she can't take any antibiotic without a yeast infection. Pt denies fever and pain. Per protocol pt needs appointment.[facility] to [facility]. Pt already has nurse visit on Monday for Depo and states she has to work. Per Enrique SackKendra message will be routed to Dr Salomon FickBanks and they will schedule if needed.  Reason for Disposition . 4 or more episodes of vaginal infection in past year  Answer Assessment - Initial Assessment Questions 1. DISCHARGE: "Describe the discharge." (e.g., white, yellow, green, gray, foamy, cottage cheese-like)     clear 2. ODOR: "Is there a bad odor?"     no 3. ONSET: "When did the discharge begin?"     today 4. RASH: "Is there a rash in that area?" If so, ask: "Describe it." (e.g., redness, blisters, sores, bumps)     no 5. ABDOMINAL PAIN: "Are you having any abdominal pain?" If yes: "What does it feel like? " (e.g., crampy, dull, intermittent, constant)      no 6. ABDOMINAL PAIN SEVERITY: If present, ask: "How bad is it?"  (e.g., mild, moderate, severe)  - MILD - doesn't interfere with normal activities   - MODERATE - interferes with normal activities or awakens from sleep   - SEVERE - patient doesn't want to move (R/O peritonitis)      0 7. CAUSE: "What do you think is causing the discharge?" "Have you had the same problem before? What happened then?"     Yeast caused by antibiotic 8. OTHER SYMPTOMS: "Do you have any other symptoms?" (e.g., fever, itching, vaginal bleeding, pain with urination, injury to genital area, vaginal foreign body)     itching 9. PREGNANCY: "Is there any chance you are pregnant?" "When was your last menstrual period?"     No  Depo shot  Monday  Protocols used: VAGINAL DISCHARGE-A-AH

## 2018-03-26 NOTE — Telephone Encounter (Signed)
Ok to send in diflucan

## 2018-03-26 NOTE — Telephone Encounter (Addendum)
Medication sent to the pharmacy.  Tried to reach the pt to inform her that Dr. Salomon FickBanks has authorized medication.  Received a message that the voicemail box is full.  Will need to try again at a later time.

## 2018-03-26 NOTE — Addendum Note (Signed)
Addended by: Raj JanusADKINS, MISTY T on: 03/26/2018 04:09 PM   Modules accepted: Orders

## 2018-03-26 NOTE — Telephone Encounter (Signed)
ok 

## 2018-03-26 NOTE — Telephone Encounter (Signed)
Attempted to contact pt regarding symptoms; left message on voicemail [phone].

## 2018-03-30 ENCOUNTER — Ambulatory Visit: Payer: BLUE CROSS/BLUE SHIELD

## 2018-03-30 NOTE — Telephone Encounter (Signed)
Attempted to call pt but mailbox is full. 

## 2018-04-09 ENCOUNTER — Ambulatory Visit (INDEPENDENT_AMBULATORY_CARE_PROVIDER_SITE_OTHER): Payer: BLUE CROSS/BLUE SHIELD

## 2018-04-09 DIAGNOSIS — Z308 Encounter for other contraceptive management: Secondary | ICD-10-CM

## 2018-04-09 MED ORDER — MEDROXYPROGESTERONE ACETATE 150 MG/ML IM SUSP
150.0000 mg | Freq: Once | INTRAMUSCULAR | Status: AC
  Administered 2018-04-09: 150 mg via INTRAMUSCULAR

## 2018-04-14 ENCOUNTER — Ambulatory Visit: Payer: Self-pay | Admitting: *Deleted

## 2018-04-14 NOTE — Telephone Encounter (Signed)
Message from Angela Nevinandice N Williams sent at 04/14/2018 4:07 PM EST   Summary: headache x4 days    Patient is requesting a call back from nurse to discuss headache for x4 days. Patient states she has had a constant headache for the past 4 days with nausea. Patient wanted to note that she received her depo shot on 12/5 but thinks it is unrelated. Please advise.          Pt calling with complains of a frontal headache x 4 days. Pt states she did have nausea on Sunday but denies nausea at this time. Pt states she has been using Tylenol, Excedrin and cold ice packs with no relief of headache. Pt states she has had headaches off and on for a while but never this bad. Pt states she has not been diagnosed with migraines and denies other symptoms at this time. Pt currently rates her pain between 6-7.Pt scheduled for appt with Dr. Salomon FickBanks on 04/16/18 at 3 pm. Pt advised that if symptoms become worse before scheduled appt to return call to the office. Pt verbalized understanding.   Reason for Disposition . [1] MILD-MODERATE headache AND [2] present > 72 hours  Answer Assessment - Initial Assessment Questions 1. LOCATION: "Where does it hurt?"      In the front of head and sometimes goes to the left 2. ONSET: "When did the headache start?" (Minutes, hours or days)      4 days 3. PATTERN: "Does the pain come and go, or has it been constant since it started?"     constant 4. SEVERITY: "How bad is the pain?" and "What does it keep you from doing?"  (e.g., Scale 1-10; mild, moderate, or severe)   - MILD (1-3): doesn't interfere with normal activities    - MODERATE (4-7): interferes with normal activities or awakens from sleep    - SEVERE (8-10): excruciating pain, unable to do any normal activities        6-7 5. RECURRENT SYMPTOM: "Have you ever had headaches before?" If so, ask: "When was the last time?" and "What happened that time?"      Has headaches every other day but not this bad, usually goes away with  medication 6. CAUSE: "What do you think is causing the headache?"     unsure 7. MIGRAINE: "Have you been diagnosed with migraine headaches?" If so, ask: "Is this headache similar?"      No 8. HEAD INJURY: "Has there been any recent injury to the head?"      No 9. OTHER SYMPTOMS: "Do you have any other symptoms?" (fever, stiff neck, eye pain, sore throat, cold symptoms)     No 10. PREGNANCY: "Is there any chance you are pregnant?" "When was your last menstrual period?"       N/a on depo on Thursday or Friday of last week  Protocols used: HEADACHE-A-AH

## 2018-04-16 ENCOUNTER — Ambulatory Visit (INDEPENDENT_AMBULATORY_CARE_PROVIDER_SITE_OTHER): Payer: BLUE CROSS/BLUE SHIELD | Admitting: Family Medicine

## 2018-04-16 ENCOUNTER — Encounter: Payer: Self-pay | Admitting: Family Medicine

## 2018-04-16 VITALS — BP 98/78 | HR 71 | Temp 98.0°F | Wt 151.0 lb

## 2018-04-16 DIAGNOSIS — G43009 Migraine without aura, not intractable, without status migrainosus: Secondary | ICD-10-CM | POA: Diagnosis not present

## 2018-04-16 MED ORDER — KETOROLAC TROMETHAMINE 60 MG/2ML IM SOLN
60.0000 mg | Freq: Once | INTRAMUSCULAR | Status: AC
  Administered 2018-04-16: 60 mg via INTRAMUSCULAR

## 2018-04-16 MED ORDER — CYCLOBENZAPRINE HCL 5 MG PO TABS: 5.0000 mg | ORAL_TABLET | Freq: Every evening | ORAL | 0 refills | Status: DC | PRN

## 2018-04-16 MED ORDER — FLUCONAZOLE 150 MG PO TABS: ORAL_TABLET | ORAL | 0 refills | Status: DC

## 2018-04-16 NOTE — Patient Instructions (Signed)
Migraine Headache A migraine headache is an intense, throbbing pain on one side or both sides of the head. Migraines may also cause other symptoms, such as nausea, vomiting, and sensitivity to light and noise. What are the causes? Doing or taking certain things may also trigger migraines, such as:  Alcohol.  Smoking.  Medicines, such as: ? Medicine used to treat chest pain (nitroglycerine). ? Birth control pills. ? Estrogen pills. ? Certain blood pressure medicines.  Aged cheeses, chocolate, or caffeine.  Foods or drinks that contain nitrates, glutamate, aspartame, or tyramine.  Physical activity.  Other things that may trigger a migraine include:  Menstruation.  Pregnancy.  Hunger.  Stress, lack of sleep, too much sleep, or fatigue.  Weather changes.  What increases the risk? The following factors may make you more likely to experience migraine headaches:  Age. Risk increases with age.  Family history of migraine headaches.  Being Caucasian.  Depression and anxiety.  Obesity.  Being a woman.  Having a hole in the heart (patent foramen ovale) or other heart problems.  What are the signs or symptoms? The main symptom of this condition is pulsating or throbbing pain. Pain may:  Happen in any area of the head, such as on one side or both sides.  Interfere with daily activities.  Get worse with physical activity.  Get worse with exposure to bright lights or loud noises.  Other symptoms may include:  Nausea.  Vomiting.  Dizziness.  General sensitivity to bright lights, loud noises, or smells.  Before you get a migraine, you may get warning signs that a migraine is developing (aura). An aura may include:  Seeing flashing lights or having blind spots.  Seeing bright spots, halos, or zigzag lines.  Having tunnel vision or blurred vision.  Having numbness or a tingling feeling.  Having trouble talking.  Having muscle weakness.  How is this  diagnosed? A migraine headache can be diagnosed based on:  Your symptoms.  A physical exam.  Tests, such as CT scan or MRI of the head. These imaging tests can help rule out other causes of headaches.  Taking fluid from the spine (lumbar puncture) and analyzing it (cerebrospinal fluid analysis, or CSF analysis).  How is this treated? A migraine headache is usually treated with medicines that:  Relieve pain.  Relieve nausea.  Prevent migraines from coming back.  Treatment may also include:  Acupuncture.  Lifestyle changes like avoiding foods that trigger migraines.  Follow these instructions at home: Medicines  Take over-the-counter and prescription medicines only as told by your health care provider.  Do not drive or use heavy machinery while taking prescription pain medicine.  To prevent or treat constipation while you are taking prescription pain medicine, your health care provider may recommend that you: ? Drink enough fluid to keep your urine clear or pale yellow. ? Take over-the-counter or prescription medicines. ? Eat foods that are high in fiber, such as fresh fruits and vegetables, whole grains, and beans. ? Limit foods that are high in fat and processed sugars, such as fried and sweet foods. Lifestyle  Avoid alcohol use.  Do not use any products that contain nicotine or tobacco, such as cigarettes and e-cigarettes. If you need help quitting, ask your health care provider.  Get at least 8 hours of sleep every night.  Limit your stress. General instructions   Keep a journal to find out what may trigger your migraine headaches. For example, write down: ? What you eat and   drink. ? How much sleep you get. ? Any change to your diet or medicines.  If you have a migraine: ? Avoid things that make your symptoms worse, such as bright lights. ? It may help to lie down in a dark, quiet room. ? Do not drive or use heavy machinery. ? Ask your health care provider  what activities are safe for you while you are experiencing symptoms.  Keep all follow-up visits as told by your health care provider. This is important. Contact a health care provider if:  You develop symptoms that are different or more severe than your usual migraine symptoms. Get help right away if:  Your migraine becomes severe.  You have a fever.  You have a stiff neck.  You have vision loss.  Your muscles feel weak or like you cannot control them.  You start to lose your balance often.  You develop trouble walking.  You faint. This information is not intended to replace advice given to you by your health care provider. Make sure you discuss any questions you have with your health care provider. Document Released: 04/22/2005 Document Revised: 11/10/2015 Document Reviewed: 10/09/2015 Elsevier Interactive Patient Education  2017 Elsevier Inc.   

## 2018-04-16 NOTE — Progress Notes (Signed)
Subjective:    Patient ID: Kathy Cain, female    DOB: 21-Jan-1991, 27 y.o.   MRN: [id number]  No chief complaint on file.   HPI Patient was seen today for acute concern.  Pt endorses headache off and on x4 days.  Started on Saturday with nausea, blurred vision, pain in L frontal area moving to R frontal area.  Pt endorses maxillary pressure.  Pt tried Goody powder, Tylenol, Excedrin, ibuprofen.  Pt has increased p.o. intake of water to 3 bottles per day.  Not drinking much caffeine getting plenty of rest.  Pt tried to get a massage, notes muscles in her neck were tense.  Felt worse after massage.  Pt notes some increased stress.  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  Of note pt is on an antibiotic (doxycycline?)  As prescribed by a specialist.  History reviewed. No pertinent past medical history.  No Known Allergies  ROS General: Denies fever, chills, night sweats, changes in weight, changes in appetite HEENT: Denies ear pain, changes in vision, rhinorrhea, sore throat  + headache CV: Denies CP, palpitations, SOB, orthopnea Pulm: Denies SOB, cough, wheezing GI: Denies abdominal pain, nausea, vomiting, diarrhea, constipation GU: Denies dysuria, hematuria, frequency, vaginal discharge Msk: Denies muscle cramps, joint pains  + muscle tension in neck Neuro: Denies weakness, numbness, tingling Skin: Denies rashes, bruising Psych: Denies depression, anxiety, hallucinations    Objective:    Blood pressure 98/78, pulse 71, temperature 98 F (36.7 C), temperature source Oral, weight 151 lb (68.5 kg), SpO2 98 %.  Gen. Pleasant, well-nourished, in no distress, normal affect   HEENT: [place]/AT, face symmetric, no scleral icterus, PERRLA, EOMI, no nystagmus, nares patent without drainage.  TMs normal bilaterally. Lungs: no accessory muscle use, CTAB, no wheezes or rales Cardiovascular: RRR, no m/r/g, no peripheral edema Musculoskeletal: No  deformities, no cyanosis or clubbing, normal tone Neuro:  A&Ox3, CN II-XII intact, normal gait.   Skin:  Warm, no lesions/ rash  Wt Readings from Last 3 Encounters:  04/16/18 151 lb (68.5 kg)  01/07/18 143 lb (64.9 kg)  08/28/17 135 lb (61.2 kg)    Lab Results  Component Value Date   WBC 4.7 08/28/2017   HGB 11.9 (L) 08/28/2017   HCT 35.4 (L) 08/28/2017   PLT 310.0 08/28/2017   GLUCOSE 90 08/28/2017   NA 138 08/28/2017   K 3.7 08/28/2017   CL 105 08/28/2017   CREATININE 0.95 08/28/2017   BUN 21 08/28/2017   CO2 26 08/28/2017   TSH 3.12 08/28/2017    Assessment/Plan:  Migraine without aura and without status migrainosus, not intractable  -Discussed various treatment options.  Will give Toradol IM.  As pt to return to work will hold off on Zofran or Phenergan -Given handout. -Discussed further work-up for continued headaches including labs and imaging. -plan: cyclobenzaprine (FLEXERIL) 5 MG tablet, ketorolac (TORADOL) injection 60 mg  Follow-up PRN  Abbe AmsterdamShannon Banks, MD

## 2018-04-17 NOTE — Progress Notes (Addendum)
Disregard

## 2018-04-19 ENCOUNTER — Encounter: Payer: Self-pay | Admitting: Family Medicine

## 2018-05-07 DIAGNOSIS — L739 Follicular disorder, unspecified: Secondary | ICD-10-CM | POA: Diagnosis not present

## 2018-05-07 DIAGNOSIS — L299 Pruritus, unspecified: Secondary | ICD-10-CM | POA: Diagnosis not present

## 2018-05-18 ENCOUNTER — Ambulatory Visit: Payer: BLUE CROSS/BLUE SHIELD | Admitting: Family Medicine

## 2018-05-18 ENCOUNTER — Encounter: Payer: Self-pay | Admitting: Family Medicine

## 2018-05-18 VITALS — BP 110/68 | HR 102 | Temp 98.2°F | Wt 149.0 lb

## 2018-05-18 DIAGNOSIS — R51 Headache: Secondary | ICD-10-CM | POA: Diagnosis not present

## 2018-05-18 DIAGNOSIS — E559 Vitamin D deficiency, unspecified: Secondary | ICD-10-CM | POA: Diagnosis not present

## 2018-05-18 DIAGNOSIS — R519 Headache, unspecified: Secondary | ICD-10-CM

## 2018-05-18 MED ORDER — TOPIRAMATE 25 MG PO TABS: ORAL_TABLET | ORAL | 0 refills | Status: DC

## 2018-05-18 MED ORDER — VITAMIN D (ERGOCALCIFEROL) 1.25 MG (50000 UNIT) PO CAPS: 50000.0000 [IU] | ORAL_CAPSULE | ORAL | 0 refills | Status: DC

## 2018-05-18 NOTE — Progress Notes (Signed)
Subjective:    Patient ID: Kathy Cain, female    DOB: Mar 18, 1991, 28 y.o.   MRN: [id number]  No chief complaint on file.   HPI Patient was seen today for follow-up on ongoing concern.  Headaches: -Pt endorses continued HAs.  Were daily last wk. -pain in L frontal area occasionally moves to R frontal area -endorses nausea, blurred vision -would start during the day -takes Goody's powder and Tylenol -pt is extremely sensitive to caffeine so avoids it. -pt has tried to cut out bread, EtOH, refined/processed sugars and drink more water. -pt denies increased stress.  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 -pt has a trip to [place] planned in February. -pt denies FHx of Migraines  Vit D def. -lost rx for Ergocalciferol 50,000 IU -took one dose.  History reviewed. No pertinent past medical history.  No Known Allergies  ROS General: Denies fever, chills, night sweats, changes in weight, changes in appetite HEENT: Denies ear pain, changes in vision, rhinorrhea, sore throat   +HAs CV: Denies CP, palpitations, SOB, orthopnea Pulm: Denies SOB, cough, wheezing GI: Denies abdominal pain, nausea, vomiting, diarrhea, constipation GU: Denies dysuria, hematuria, frequency, vaginal discharge Msk: Denies muscle cramps, joint pains Neuro: Denies weakness, numbness, tingling Skin: Denies rashes, bruising Psych: Denies depression, anxiety, hallucinations    Objective:    Blood pressure 110/68, pulse (!) 102, temperature 98.2 F (36.8 C), temperature source Oral, weight 149 lb (67.6 kg), SpO2 98 %.  Gen. Pleasant, well-nourished, in no distress, normal affect   HEENT: [place]/AT, face symmetric, conjunctiva clear, no scleral icterus, PERRLA, EOMI, no nystagmus, nares patent without drainage, pharynx without erythema or exudate. Lungs: no accessory muscle use Cardiovascular: RRR, no m/r/g, no peripheral edema Abdomen: BS present, soft,  NT/ND, no hepatosplenomegaly. Musculoskeletal: No deformities, no cyanosis or clubbing, normal tone Neuro:  A&Ox3, CN II-XII intact, normal gait Skin:  Warm, no lesions/ rash   Wt Readings from Last 3 Encounters:  04/16/18 151 lb (68.5 kg)  01/07/18 143 lb (64.9 kg)  08/28/17 135 lb (61.2 kg)    Lab Results  Component Value Date   WBC 4.7 08/28/2017   HGB 11.9 (L) 08/28/2017   HCT 35.4 (L) 08/28/2017   PLT 310.0 08/28/2017   GLUCOSE 90 08/28/2017   NA 138 08/28/2017   K 3.7 08/28/2017   CL 105 08/28/2017   CREATININE 0.95 08/28/2017   BUN 21 08/28/2017   CO2 26 08/28/2017   TSH 3.12 08/28/2017    Assessment/Plan:  Daily headache -Discussed decreasing stress and other ways to help reduce headaches -Given handout -We will start Topamax 25 mg with plans to increase dose by 25 mg every week until at 50 mg twice daily. -Neurology referral placed - Plan: topiramate (TOPAMAX) 25 MG tablet, Ambulatory referral to Neurology -Also discussed imaging such as CT.  Patient wishes to wait at this time. -Given RTC or ED precautions.  Vitamin D deficiency -We will represcribed vitamin D as patient lost prescription prior to completion. - Plan: Vitamin D, Ergocalciferol, (DRISDOL) 1.25 MG (50000 UT) CAPS capsule -We will recheck vitamin D level a few weeks after completion of ergocalciferol 50,000 IU.  Follow-up PRN  Abbe Amsterdam, MD

## 2018-05-19 ENCOUNTER — Encounter: Payer: Self-pay | Admitting: Neurology

## 2018-05-21 ENCOUNTER — Encounter: Payer: Self-pay | Admitting: Family Medicine

## 2018-06-05 DIAGNOSIS — R079 Chest pain, unspecified: Secondary | ICD-10-CM | POA: Diagnosis not present

## 2018-06-05 DIAGNOSIS — R072 Precordial pain: Secondary | ICD-10-CM | POA: Diagnosis not present

## 2018-07-09 ENCOUNTER — Ambulatory Visit (INDEPENDENT_AMBULATORY_CARE_PROVIDER_SITE_OTHER): Payer: BLUE CROSS/BLUE SHIELD | Admitting: *Deleted

## 2018-07-09 ENCOUNTER — Ambulatory Visit: Payer: BLUE CROSS/BLUE SHIELD

## 2018-07-09 DIAGNOSIS — Z30013 Encounter for initial prescription of injectable contraceptive: Secondary | ICD-10-CM | POA: Diagnosis not present

## 2018-07-09 DIAGNOSIS — Z308 Encounter for other contraceptive management: Secondary | ICD-10-CM

## 2018-07-09 DIAGNOSIS — E559 Vitamin D deficiency, unspecified: Secondary | ICD-10-CM

## 2018-07-09 MED ORDER — VITAMIN D (ERGOCALCIFEROL) 1.25 MG (50000 UNIT) PO CAPS: 50000.0000 [IU] | ORAL_CAPSULE | ORAL | 0 refills | Status: DC

## 2018-07-09 MED ORDER — MEDROXYPROGESTERONE ACETATE 150 MG/ML IM SUSY
150.0000 mg | PREFILLED_SYRINGE | Freq: Once | INTRAMUSCULAR | Status: AC
  Administered 2018-07-09: 150 mg via INTRAMUSCULAR

## 2018-07-09 NOTE — Progress Notes (Signed)
Patient here for Dep Injection. Injection administered and tolerated well. Patient wants PCP to know she would like to come off Depo as she thinks this maybe contributing to her migraines. Patient unable to see Neuro until August 05, 2018.

## 2018-07-28 ENCOUNTER — Ambulatory Visit: Payer: BLUE CROSS/BLUE SHIELD | Admitting: Neurology

## 2018-08-11 ENCOUNTER — Ambulatory Visit: Payer: BLUE CROSS/BLUE SHIELD | Admitting: Family Medicine

## 2018-08-11 ENCOUNTER — Other Ambulatory Visit: Payer: Self-pay

## 2018-08-31 ENCOUNTER — Encounter: Payer: BLUE CROSS/BLUE SHIELD | Admitting: Family Medicine

## 2018-09-02 ENCOUNTER — Encounter: Payer: BLUE CROSS/BLUE SHIELD | Admitting: Family Medicine

## 2018-09-10 DIAGNOSIS — L299 Pruritus, unspecified: Secondary | ICD-10-CM | POA: Diagnosis not present

## 2018-09-10 DIAGNOSIS — L739 Follicular disorder, unspecified: Secondary | ICD-10-CM | POA: Diagnosis not present

## 2018-10-07 ENCOUNTER — Encounter: Payer: Self-pay | Admitting: Family Medicine

## 2018-10-07 ENCOUNTER — Other Ambulatory Visit: Payer: Self-pay

## 2018-10-07 ENCOUNTER — Ambulatory Visit (INDEPENDENT_AMBULATORY_CARE_PROVIDER_SITE_OTHER): Payer: BLUE CROSS/BLUE SHIELD | Admitting: Family Medicine

## 2018-10-07 DIAGNOSIS — M62838 Other muscle spasm: Secondary | ICD-10-CM

## 2018-10-07 DIAGNOSIS — J302 Other seasonal allergic rhinitis: Secondary | ICD-10-CM

## 2018-10-07 DIAGNOSIS — L299 Pruritus, unspecified: Secondary | ICD-10-CM

## 2018-10-07 MED ORDER — TIZANIDINE HCL 2 MG PO CAPS: 2.0000 mg | ORAL_CAPSULE | Freq: Three times a day (TID) | ORAL | 1 refills | Status: DC | PRN

## 2018-10-07 NOTE — Progress Notes (Signed)
Virtual Visit via Video Note  I connected with Kathy Cain on 10/07/18 at  9:30 AM EDT by a video enabled telemedicine application and verified that I am speaking with the correct person using two identifiers.  Location patient: home Location provider:work or home office Persons participating in the virtual visit: patient, provider  I discussed the limitations of evaluation and management by telemedicine and the availability of in person appointments. The patient expressed understanding and agreed to proceed.   HPI: Pt still having neck tension.  Feels like "back needs to be popped".  May wake up with the feeling or happens as the day goes on.  In the back of L neck can feel muscles tensing up.  5/10 pain.  Nothing makes it better.  Flexeril makes pt sleepy.  If takes at night still feels groggy the next day.  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  Pt endorses doing more physical work at new job.  Pt notes she has seen Dermatology since last OFV for pruritis.  Now having pruritis in b/l arm pits.  Has been given various creams.    H/o seasonal allergies.  Notes increase in symptoms.  Taking Zyrtec.   ROS: See pertinent positives and negatives per HPI.  No past medical history on file.  No past surgical history on file.  No family history on file.   Current Outpatient Medications:  .  cetirizine (ZYRTEC) 10 MG tablet, Take 1 tablet (10 mg total) by mouth daily., Disp: 30 tablet, Rfl: 2 .  cyclobenzaprine (FLEXERIL) 5 MG tablet, Take 1 tablet (5 mg total) by mouth at bedtime as needed for muscle spasms., Disp: 30 tablet, Rfl: 0 .  fluconazole (DIFLUCAN) 150 MG tablet, Take 1 tab today and repeat after 3 days, Disp: 2 tablet, Rfl: 0 .  topiramate (TOPAMAX) 25 MG tablet, Take 25 mg nightly x 1 wk.  Then take 25 mg BID x 1 wk.  Then take 25 mg in am and 50 mg in pm x 1 wk.  Then 50 mg BID., Disp: 70 tablet, Rfl: 0 .  Vitamin D, Ergocalciferol,  (DRISDOL) 1.25 MG (50000 UT) CAPS capsule, Take 1 capsule (50,000 Units total) by mouth every 7 (seven) days., Disp: 12 capsule, Rfl: 0  EXAM:  VITALS per patient if applicable: RR between 12-20 bpm  GENERAL: alert, oriented, appears well and in no acute distress  HEENT: atraumatic, conjunctiva clear, no obvious abnormalities on inspection of external nose and ears  NECK: normal movements of the head and neck  LUNGS: on inspection no signs of respiratory distress, breathing rate appears normal, no obvious gross SOB, gasping or wheezing  CV: no obvious cyanosis  MS: moves all visible extremities without noticeable abnormality  PSYCH/NEURO: pleasant and cooperative, no obvious depression or anxiety, speech and thought processing grossly intact  ASSESSMENT AND PLAN:  Discussed the following assessment and plan:  Neck muscle spasm  -discussed trying a different muscle relaxer to see if will make less sleepy. -d/c flexeril -discussed PT.  Given likely frequency of appointments, pt declines at this time. -Pt interested in Chiropractor.  Will schedule an appointment. - Plan: tizanidine (ZANAFLEX) 2 MG capsule  Pruritus -continue f/u with Dermatology -consider different allergy med  Seasonal allergies -consider switching allergy meds  F/u prn   I discussed the assessment and treatment plan with the patient. The patient was provided an opportunity to ask questions and all were answered. The patient agreed  with the plan and demonstrated an understanding of the instructions.   The patient was advised to call back or seek an in-person evaluation if the symptoms worsen or if the condition fails to improve as anticipated.   Billie Ruddy, MD

## 2018-10-28 ENCOUNTER — Ambulatory Visit: Payer: BLUE CROSS/BLUE SHIELD | Admitting: Neurology

## 2018-10-30 ENCOUNTER — Other Ambulatory Visit: Payer: Self-pay

## 2018-10-30 ENCOUNTER — Ambulatory Visit: Payer: Self-pay | Admitting: Family Medicine

## 2018-11-02 ENCOUNTER — Ambulatory Visit: Payer: Self-pay

## 2018-11-04 ENCOUNTER — Other Ambulatory Visit: Payer: Self-pay

## 2018-11-04 ENCOUNTER — Ambulatory Visit (INDEPENDENT_AMBULATORY_CARE_PROVIDER_SITE_OTHER): Payer: Self-pay | Admitting: *Deleted

## 2018-11-04 DIAGNOSIS — Z308 Encounter for other contraceptive management: Secondary | ICD-10-CM

## 2018-11-04 LAB — POCT URINE PREGNANCY: Preg Test, Ur: NEGATIVE

## 2018-11-04 MED ORDER — MEDROXYPROGESTERONE ACETATE 150 MG/ML IM SUSP
150.0000 mg | Freq: Once | INTRAMUSCULAR | Status: AC
  Administered 2018-11-04: 150 mg via INTRAMUSCULAR

## 2018-11-09 NOTE — Progress Notes (Signed)
Medical screening examination/treatment was performed by qualified clinical staff member and as supervising physician I was immediately available for consultation/collaboration. I have reviewed documentation and agree with assessment and plan.  Shannon R Banks, MD  

## 2018-11-12 ENCOUNTER — Encounter: Payer: BLUE CROSS/BLUE SHIELD | Admitting: Family Medicine

## 2018-12-02 ENCOUNTER — Telehealth: Payer: Self-pay | Admitting: Family Medicine

## 2018-12-02 ENCOUNTER — Other Ambulatory Visit: Payer: Self-pay

## 2018-12-02 ENCOUNTER — Encounter: Payer: Self-pay | Admitting: Family Medicine

## 2018-12-02 ENCOUNTER — Ambulatory Visit (INDEPENDENT_AMBULATORY_CARE_PROVIDER_SITE_OTHER): Payer: No Typology Code available for payment source | Admitting: Family Medicine

## 2018-12-02 VITALS — BP 102/78 | HR 93 | Temp 99.0°F | Ht 63.0 in | Wt 145.2 lb

## 2018-12-02 DIAGNOSIS — L299 Pruritus, unspecified: Secondary | ICD-10-CM

## 2018-12-02 DIAGNOSIS — Z0000 Encounter for general adult medical examination without abnormal findings: Secondary | ICD-10-CM

## 2018-12-02 DIAGNOSIS — E559 Vitamin D deficiency, unspecified: Secondary | ICD-10-CM

## 2018-12-02 DIAGNOSIS — Z13220 Encounter for screening for lipoid disorders: Secondary | ICD-10-CM

## 2018-12-02 DIAGNOSIS — Z113 Encounter for screening for infections with a predominantly sexual mode of transmission: Secondary | ICD-10-CM | POA: Diagnosis not present

## 2018-12-02 LAB — COMPREHENSIVE METABOLIC PANEL
ALT: 11 U/L (ref 0–35)
AST: 13 U/L (ref 0–37)
Albumin: 4.3 g/dL (ref 3.5–5.2)
Alkaline Phosphatase: 62 U/L (ref 39–117)
BUN: 17 mg/dL (ref 6–23)
CO2: 28 mEq/L (ref 19–32)
Calcium: 9.5 mg/dL (ref 8.4–10.5)
Chloride: 108 mEq/L (ref 96–112)
Creatinine, Ser: 0.99 mg/dL (ref 0.40–1.20)
GFR: 81.4 mL/min (ref >60.00)
Glucose, Bld: 89 mg/dL (ref 70–99)
Potassium: 4.2 mEq/L (ref 3.5–5.1)
Sodium: 142 mEq/L (ref 135–145)
Total Bilirubin: 0.5 mg/dL (ref 0.2–1.2)
Total Protein: 7.6 g/dL (ref 6.0–8.3)

## 2018-12-02 LAB — CBC
HCT: 38.3 % (ref 36.0–46.0)
Hemoglobin: 12.3 g/dL (ref 12.0–15.0)
MCHC: 32.1 g/dL (ref 30.0–36.0)
MCV: 84.2 fl (ref 78.0–100.0)
Platelets: 354 10*3/uL (ref 150.0–400.0)
RBC: 4.55 Mil/uL (ref 3.87–5.11)
RDW: 14.2 % (ref 11.5–15.5)
WBC: 4.6 10*3/uL (ref 4.0–10.5)

## 2018-12-02 LAB — VITAMIN D 25 HYDROXY (VIT D DEFICIENCY, FRACTURES): VITD: 12.09 ng/mL — ABNORMAL LOW (ref 30.00–100.00)

## 2018-12-02 LAB — LIPID PANEL
Cholesterol: 134 mg/dL (ref 0–200)
HDL: 55.9 mg/dL (ref >39.00)
LDL Cholesterol: 70 mg/dL (ref 0–99)
NonHDL: 77.91
Total CHOL/HDL Ratio: 2
Triglycerides: 42 mg/dL (ref 0.0–149.0)
VLDL: 8.4 mg/dL (ref 0.0–40.0)

## 2018-12-02 NOTE — Progress Notes (Signed)
Subjective:     Kathy Cain is a 28 y.o. female and is here for a comprehensive physical exam. The patient reports problems - pruritis.  Pt continues to have pruritis.  Seen by [facility].  Has tried several creams, abxs, allergy medications, and other pills for itching.  Pt told she had folliculitis.  Pt has not had a skin biopsy.  Pt with continued itching in b/l armpits.  Was having anal pruritis, now resolved.  Otherwise pt is doing well.  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  Pt will be starting school, a bridge program for LPN to RN.  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  Social History   Socioeconomic History  . Marital status: Single    Spouse name: Not on file  . Number of children: Not on file  . Years of education: Not on file  . Highest education level: Not on file  Occupational History  . Not on file  Social Needs  . Financial resource strain: Not on file  . Food insecurity    Worry: Not on file    Inability: Not on file  . Transportation needs    Medical: Not on file    Non-medical: Not on file  Tobacco Use  . Smoking status: Never Smoker  . Smokeless tobacco: Never Used  Substance and Sexual Activity  . Alcohol use: Yes    Comment: ocass.   . Drug use: No  . Sexual activity: Yes    Birth control/protection: Injection  Lifestyle  . Physical activity    Days per week: Not on file    Minutes per session: Not on file  . Stress: Not on file  Relationships  . Social Musicianconnections    Talks on phone: Not on file    Gets together: Not on file    Attends religious service: Not on file    Active member of club or organization: Not on file    Attends meetings of clubs or organizations: Not on file    Relationship status: Not on file  . Intimate partner violence    Fear of current or ex partner: Not on file    Emotionally abused: Not on file    Physically abused: Not on file     Forced sexual activity: Not on file  Other Topics Concern  . Not on file  Social History Narrative  . Not on file   Health Maintenance  Topic Date Due  . TETANUS/TDAP  11/25/2010  . INFLUENZA VACCINE  12/05/2018  . PAP-Cervical Cytology Screening  08/28/2020  . PAP SMEAR-Modifier  08/28/2020  . HIV Screening  Completed    The following portions of the patient's history were reviewed and updated as appropriate: allergies, current medications, past family history, past medical history, past social history, past surgical history and problem list.  Review of Systems A comprehensive review of systems was negative.   Objective:    BP 102/78 (BP Location: Left Arm, Patient Position: Sitting, Cuff Size: Normal)   Pulse 93   Temp 99 F (37.2 C) (Oral)   Ht 5\' 3"  (1.6 m)   Wt 145 lb 3.2 oz (65.9 kg)   SpO2 95%   BMI 25.72 kg/m  General appearance: alert, cooperative and no distress Head: Normocephalic, without obvious abnormality, atraumatic Eyes: conjunctivae/corneas clear. PERRL, EOM's intact. Fundi benign. Ears: normal TM's and external ear canals both ears  Nose: Nares normal. Septum midline. Mucosa normal. No drainage or sinus tenderness. Throat: lips, mucosa, and tongue normal; teeth and gums normal Neck: no adenopathy, no carotid bruit, no JVD, supple, symmetrical, trachea midline and thyroid not enlarged, symmetric, no tenderness/mass/nodules Lungs: clear to auscultation bilaterally Heart: regular rate and rhythm, S1, S2 normal, no murmur, click, rub or gallop Abdomen: soft, non-tender; bowel sounds normal; no masses,  no organomegaly Extremities: extremities normal, atraumatic, no cyanosis or edema Pulses: 2+ and symmetric Skin: Skin color, texture, turgor normal. No rashes or lesions Lymph nodes: Cervical, supraclavicular, and axillary nodes normal. Neurologic: Alert and oriented X 3, normal strength and tone. Normal symmetric reflexes. Normal coordination and gait     Assessment:    Healthy female exam.      Plan:     Anticipatory guidance given including wearing seatbelts, smoke detectors in the home, increasing physical activity, increasing p.o. intake of water and vegetables. -will order labs -pap up to date.  Due on or after 08/28/20 -immunizations up to date -given handout See After Visit Summary for Counseling Recommendations    Screen for STIs -will order labs  Pruritis -chronic -placed referral to [facility]  Vit D deficiency -will recheck Vit D -replace if needed  F/u prn  Grier Mitts, MD

## 2018-12-02 NOTE — Telephone Encounter (Signed)
This patient is returning a missed call from Sylvester twice. There was no message left and there was no telephone note created. I told the patient that I will have the nurse to call her back and she stated that she can just come back up here if we are not able to have the nurse call here.  Please advise

## 2018-12-02 NOTE — Patient Instructions (Signed)
Preventive Care 21-27 Years Old, Female Preventive care refers to visits with your health care provider and lifestyle choices that can promote health and wellness. This includes:  A yearly physical exam. This may also be called an annual well check.  Regular dental visits and eye exams.  Immunizations.  Screening for certain conditions.  Healthy lifestyle choices, such as eating a healthy diet, getting regular exercise, not using drugs or products that contain nicotine and tobacco, and limiting alcohol use. What can I expect for my preventive care visit? Physical exam Your health care provider will check your:  Height and weight. This may be used to calculate body mass index (BMI), which tells if you are at a healthy weight.  Heart rate and blood pressure.  Skin for abnormal spots. Counseling Your health care provider may ask you questions about your:  Alcohol, tobacco, and drug use.  Emotional well-being.  Home and relationship well-being.  Sexual activity.  Eating habits.  Work and work environment.  Method of birth control.  Menstrual cycle.  Pregnancy history. What immunizations do I need?  Influenza (flu) vaccine  This is recommended every year. Tetanus, diphtheria, and pertussis (Tdap) vaccine  You may need a Td booster every 10 years. Varicella (chickenpox) vaccine  You may need this if you have not been vaccinated. Human papillomavirus (HPV) vaccine  If recommended by your health care provider, you may need three doses over 6 months. Measles, mumps, and rubella (MMR) vaccine  You may need at least one dose of MMR. You may also need a second dose. Meningococcal conjugate (MenACWY) vaccine  One dose is recommended if you are age 19-21 years and a first-year college student living in a residence hall, or if you have one of several medical conditions. You may also need additional booster doses. Pneumococcal conjugate (PCV13) vaccine  You may need  this if you have certain conditions and were not previously vaccinated. Pneumococcal polysaccharide (PPSV23) vaccine  You may need one or two doses if you smoke cigarettes or if you have certain conditions. Hepatitis A vaccine  You may need this if you have certain conditions or if you travel or work in places where you may be exposed to hepatitis A. Hepatitis B vaccine  You may need this if you have certain conditions or if you travel or work in places where you may be exposed to hepatitis B. Haemophilus influenzae type b (Hib) vaccine  You may need this if you have certain conditions. You may receive vaccines as individual doses or as more than one vaccine together in one shot (combination vaccines). Talk with your health care provider about the risks and benefits of combination vaccines. What tests do I need?  Blood tests  Lipid and cholesterol levels. These may be checked every 5 years starting at age 20.  Hepatitis C test.  Hepatitis B test. Screening  Diabetes screening. This is done by checking your blood sugar (glucose) after you have not eaten for a while (fasting).  Sexually transmitted disease (STD) testing.  BRCA-related cancer screening. This may be done if you have a family history of breast, ovarian, tubal, or peritoneal cancers.  Pelvic exam and Pap test. This may be done every 3 years starting at age 21. Starting at age 30, this may be done every 5 years if you have a Pap test in combination with an HPV test. Talk with your health care provider about your test results, treatment options, and if necessary, the need for more tests.   Follow these instructions at home: Eating and drinking   Eat a diet that includes fresh fruits and vegetables, whole grains, lean protein, and low-fat dairy.  Take vitamin and mineral supplements as recommended by your health care provider.  Do not drink alcohol if: ? Your health care provider tells you not to drink. ? You are  pregnant, may be pregnant, or are planning to become pregnant.  If you drink alcohol: ? Limit how much you have to 0-1 drink a day. ? Be aware of how much alcohol is in your drink. In the [place] one drink equals one 12 oz bottle of beer (355 mL), one 5 oz glass of wine (148 mL), or one 1 oz glass of hard liquor (44 mL). Lifestyle  Take daily care of your teeth and gums.  Stay active. Exercise for at least 30 minutes on 5 or more days each week.  Do not use any products that contain nicotine or tobacco, such as cigarettes, e-cigarettes, and chewing tobacco. If you need help quitting, ask your health care provider.  If you are sexually active, practice safe sex. Use a condom or other form of birth control (contraception) in order to prevent pregnancy and STIs (sexually transmitted infections). If you plan to become pregnant, see your health care provider for a preconception visit. What's next?  Visit your health care provider once a year for a well check visit.  Ask your health care provider how often you should have your eyes and teeth checked.  Stay up to date on all vaccines. This information is not intended to replace advice given to you by your health care provider. Make sure you discuss any questions you have with your health care provider. Document Released: 06/18/2001 Document Revised: 01/01/2018 Document Reviewed: 01/01/2018 Elsevier Patient Education  [place].  Pruritus Pruritus is an itchy feeling on the skin. One of the most common causes is dry skin, but many different things can cause itching. Most cases of itching do not require medical attention. Sometimes itchy skin can turn into a rash. Follow these instructions at home: Skin care   Apply moisturizing lotion to your skin as needed. Lotion that contains petroleum jelly is best.  Take medicines or apply medicated creams only as told by your health care provider. This may include: ? Corticosteroid cream. ?  Anti-itch lotions. ? Oral antihistamines.  Apply a cool, wet cloth (cool compress) to the affected areas.  Take baths with one of the following: ? Epsom salts. You can get these at your local pharmacy or grocery store. Follow the instructions on the packaging. ? Baking soda. Pour a small amount into the bath as told by your health care provider. ? Colloidal oatmeal. You can get this at your local pharmacy or grocery store. Follow the instructions on the packaging.  Apply baking soda paste to your skin. To make the paste, stir water into a small amount of baking soda until it reaches a paste-like consistency.  Do not scratch your skin.  Do not take hot showers or baths, which can make itching worse. A cool shower may help with itching as long as you apply moisturizing lotion after the shower.  Do not use scented soaps, detergents, perfumes, and cosmetic products. Instead, use gentle, unscented versions of these items. General instructions  Avoid wearing tight clothes.  Keep a journal to help find out what is causing your itching. Write down: ? What you eat and drink. ? What cosmetic products you use. ?  What soaps or detergents you use. ? What you wear, including jewelry.  Use a humidifier. This keeps the air moist, which helps to prevent dry skin.  Be aware of any changes in your itchiness. Contact a health care provider if:  The itching does not go away after several days.  You are unusually thirsty or urinating more than normal.  Your skin tingles or feels numb.  Your skin or the white parts of your eyes turn yellow (jaundice).  You feel weak.  You have any of the following: ? Night sweats. ? Tiredness (fatigue). ? Weight loss. ? Abdominal pain. Summary  Pruritus is an itchy feeling on the skin. One of the most common causes is dry skin, but many different conditions and factors can cause itching.  Apply moisturizing lotion to your skin as needed. Lotion that  contains petroleum jelly is best.  Take medicines or apply medicated creams only as told by your health care provider.  Do not take hot showers or baths. Do not use scented soaps, detergents, perfumes, or cosmetic products. This information is not intended to replace advice given to you by your health care provider. Make sure you discuss any questions you have with your health care provider. Document Released: 01/02/2011 Document Revised: 05/06/2017 Document Reviewed: 05/06/2017 Elsevier Patient Education  2020 [employer].

## 2018-12-02 NOTE — Telephone Encounter (Signed)
Patient was seen in clinic today. Clinic RN called to do screening before she was brought into clinic but she did not answer.

## 2018-12-03 ENCOUNTER — Other Ambulatory Visit: Payer: Self-pay | Admitting: Family Medicine

## 2018-12-03 LAB — HIV ANTIBODY (ROUTINE TESTING W REFLEX): HIV 1&2 Ab, 4th Generation: NONREACTIVE

## 2018-12-03 LAB — RPR TITER: RPR Titer: 1:1 {titer} — ABNORMAL HIGH

## 2018-12-03 LAB — FLUORESCENT TREPONEMAL AB(FTA)-IGG-BLD: Fluorescent Treponemal ABS: REACTIVE — AB

## 2018-12-03 LAB — RPR: RPR Ser Ql: REACTIVE — AB

## 2018-12-03 LAB — C. TRACHOMATIS/N. GONORRHOEAE RNA
C. trachomatis RNA, TMA: NOT DETECTED
N. gonorrhoeae RNA, TMA: NOT DETECTED

## 2018-12-03 MED ORDER — VITAMIN D (ERGOCALCIFEROL) 1.25 MG (50000 UNIT) PO CAPS: 50000.0000 [IU] | ORAL_CAPSULE | ORAL | 0 refills | Status: DC

## 2018-12-09 ENCOUNTER — Telehealth: Payer: Self-pay | Admitting: Family Medicine

## 2018-12-09 NOTE — Telephone Encounter (Signed)
Copied from [place]. Topic: General - Other >> Dec 09, 2018 10:53 AM Marin Olp L wrote: Reason for CRM: Patient calling back for lab results

## 2018-12-10 NOTE — Telephone Encounter (Signed)
Brooks from [facility] called. Per Rolena Infante patient was tested at [facility] and her RPR was negative and her TPPA (confirmatory) is pending. When results come back that will determine if patient does treatment.

## 2018-12-10 NOTE — Telephone Encounter (Signed)
ok 

## 2019-01-25 ENCOUNTER — Other Ambulatory Visit: Payer: Self-pay

## 2019-01-25 ENCOUNTER — Ambulatory Visit (INDEPENDENT_AMBULATORY_CARE_PROVIDER_SITE_OTHER): Payer: No Typology Code available for payment source | Admitting: Family Medicine

## 2019-01-25 DIAGNOSIS — K219 Gastro-esophageal reflux disease without esophagitis: Secondary | ICD-10-CM

## 2019-01-25 DIAGNOSIS — G43009 Migraine without aura, not intractable, without status migrainosus: Secondary | ICD-10-CM | POA: Diagnosis not present

## 2019-01-25 MED ORDER — RIZATRIPTAN BENZOATE 10 MG PO TBDP: 10.0000 mg | ORAL_TABLET | ORAL | 1 refills | Status: DC | PRN

## 2019-01-25 NOTE — Progress Notes (Signed)
Patient ID: Kathy Cain, female   DOB: [birth year], 28 y.o.   MRN: [id number]  This visit type was conducted due to national recommendations for restrictions regarding the COVID-19 pandemic in an effort to limit this patient's exposure and mitigate transmission in our community.   Virtual Visit via Video Note  I connected with Kathy Cain on 01/25/19 at  3:45 PM EDT by a video enabled telemedicine application and verified that I am speaking with the correct person using two identifiers.  Location patient: home Location provider:work or home office Persons participating in the virtual visit: patient, provider  I discussed the limitations of evaluation and management by telemedicine and the availability of in person appointments. The patient expressed understanding and agreed to proceed.   HPI: Patient has had some frequent headaches which she describes as migraines.  These have been diagnosed as migraine without aura previously.  She has headaches that are usually unilateral and frequently throbbing and at least moderate severity.  She has sometimes associated nausea usually without vomiting.  No photosensitivity.  Worsening with activity.  She has taken things like Advil and Tylenol without relief.  She had been prescribed Topamax for prevention but apparently was concerned about possible side effects and never started.  She is not sure of specific triggers.  She is on Depo-Provera and apparently there have been discussions of her coming off this fairly soon.  She does wish to go on alternative birth control and is not looking at getting pregnant anytime soon.  Other issues that she has frequent sour taste in her mouth and thinks he may be have some reflux symptoms related to the nausea.  She has not had any focal or localized abdominal pain.  No appetite or weight changes.   ROS: See pertinent positives and negatives per HPI.  No past medical history on file.  No past surgical  history on file.  No family history on file.  SOCIAL HX: Non-smoker.  No regular alcohol use.   Current Outpatient Medications:  .  cetirizine (ZYRTEC) 10 MG tablet, Take 1 tablet (10 mg total) by mouth daily., Disp: 30 tablet, Rfl: 2 .  cyclobenzaprine (FLEXERIL) 5 MG tablet, Take 1 tablet (5 mg total) by mouth at bedtime as needed for muscle spasms. (Patient not taking: Reported on 12/02/2018), Disp: 30 tablet, Rfl: 0 .  fluconazole (DIFLUCAN) 150 MG tablet, Take 1 tab today and repeat after 3 days (Patient not taking: Reported on 12/02/2018), Disp: 2 tablet, Rfl: 0 .  rizatriptan (MAXALT-MLT) 10 MG disintegrating tablet, Take 1 tablet (10 mg total) by mouth as needed for migraine. May repeat in 2 hours if needed, Disp: 10 tablet, Rfl: 1 .  tizanidine (ZANAFLEX) 2 MG capsule, Take 1 capsule (2 mg total) by mouth 3 (three) times daily as needed for muscle spasms., Disp: 30 capsule, Rfl: 1 .  Vitamin D, Ergocalciferol, (DRISDOL) 1.25 MG (50000 UT) CAPS capsule, Take 1 capsule (50,000 Units total) by mouth every 7 (seven) days., Disp: 12 capsule, Rfl: 0 .  Vitamin D, Ergocalciferol, (DRISDOL) 1.25 MG (50000 UT) CAPS capsule, Take 1 capsule (50,000 Units total) by mouth every 7 (seven) days., Disp: 12 capsule, Rfl: 0  EXAM:  VITALS per patient if applicable:  GENERAL: alert, oriented, appears well and in no acute distress  HEENT: atraumatic, conjunttiva clear, no obvious abnormalities on inspection of external nose and ears  NECK: normal movements of the head and neck  LUNGS: on inspection no signs of respiratory distress, breathing  rate appears normal, no obvious gross SOB, gasping or wheezing  CV: no obvious cyanosis  MS: moves all visible extremities without noticeable abnormality  PSYCH/NEURO: pleasant and cooperative, no obvious depression or anxiety, speech and thought processing grossly intact  ASSESSMENT AND PLAN:  Discussed the following assessment and plan:  #1 migraine  headaches.  These apparently are occurring fairly frequently usually over 5-6 episodes per month.  Poor relief with over-the-counter medications  -Discussed trial of Maxalt MLT 10 mg 1 at onset of migraine and may repeat 1 in 2 hours as needed but no more than 2 in 24 hours -We discussed various migraine triggers.  Suggested headache diary and close follow-up with primary.  May need to look at other prophylactic options  #2 GERD.  She is had recent increased symptoms which could be accounting for some of her nausea.  -Recommend trial of over-the-counter Pepcid 20 mg once or twice daily or Nexium 20 mg daily.  Follow-up with primary if symptoms not relieved with that over the next several days -Also discussed dietary factors that can trigger GERD     I discussed the assessment and treatment plan with the patient. The patient was provided an opportunity to ask questions and all were answered. The patient agreed with the plan and demonstrated an understanding of the instructions.   The patient was advised to call back or seek an in-person evaluation if the symptoms worsen or if the condition fails to improve as anticipated.     Evelena PeatBruce , MD

## 2019-01-27 ENCOUNTER — Telehealth: Payer: Self-pay

## 2019-01-27 NOTE — Telephone Encounter (Signed)
Copied from [place] [id number]. Topic: General - Other >> Jan 27, 2019 11:54 AM Rainey Pines A wrote: Patient would like a callback from Dr. Elease Hashimoto nurse in regards to being written out of work due to her not feeling well. Patient is experiencing the vomiting and nausea still since her appointment 09/21

## 2019-01-27 NOTE — Telephone Encounter (Signed)
I do not see a follow up with Dr. Volanda Napoleon. Please advise.

## 2019-01-28 NOTE — Telephone Encounter (Signed)
If still experiencing nausea and vomiting, may send in Zofran ODT 4 mg every 8 hours prn #20.  She needs to set up visit.with Dr Volanda Napoleon if headaches or nausea symptoms persist.  Will likely need office follow up.  If she is needing work note, get specific dates she has been out.

## 2019-01-29 ENCOUNTER — Other Ambulatory Visit: Payer: Self-pay

## 2019-01-29 MED ORDER — ONDANSETRON 4 MG PO TBDP: 4.0000 mg | ORAL_TABLET | Freq: Three times a day (TID) | ORAL | 0 refills | Status: DC | PRN

## 2019-01-29 NOTE — Telephone Encounter (Signed)
Called patient and gave her message. Sent Zofran ODT 4mg  to [facility].   Patient stated that she needs a note to be out of work starting Wednesday, 01/27/19 and would like through Tuesday 02/02/19. Patient has a follow up with Dr. Volanda Napoleon on 02/02/19 since this was the first available time to schedule with Dr. Volanda Napoleon.  Please let me know once work note is ready and I will leave up front for patient.

## 2019-01-29 NOTE — Telephone Encounter (Signed)
written

## 2019-01-29 NOTE — Telephone Encounter (Signed)
Pt aware form is ready. She will come to office to pick up.

## 2019-02-02 ENCOUNTER — Other Ambulatory Visit: Payer: Self-pay

## 2019-02-02 ENCOUNTER — Telehealth (INDEPENDENT_AMBULATORY_CARE_PROVIDER_SITE_OTHER): Payer: No Typology Code available for payment source | Admitting: Family Medicine

## 2019-02-02 DIAGNOSIS — F439 Reaction to severe stress, unspecified: Secondary | ICD-10-CM | POA: Diagnosis not present

## 2019-02-02 DIAGNOSIS — L299 Pruritus, unspecified: Secondary | ICD-10-CM

## 2019-02-02 DIAGNOSIS — Z3009 Encounter for other general counseling and advice on contraception: Secondary | ICD-10-CM

## 2019-02-02 DIAGNOSIS — G43009 Migraine without aura, not intractable, without status migrainosus: Secondary | ICD-10-CM | POA: Diagnosis not present

## 2019-02-02 MED ORDER — NORETHIN ACE-ETH ESTRAD-FE 1-20 MG-MCG(24) PO TABS: 1.0000 | ORAL_TABLET | Freq: Every day | ORAL | 11 refills | Status: DC

## 2019-02-02 NOTE — Progress Notes (Signed)
Virtual Visit via Video Note  I connected with Kathy Cain on 02/02/19 at  4:30 PM EDT by a video enabled telemedicine application 2/2 [id number] pandemic and verified that I am speaking with the correct person using two identifiers.  Location patient: in her car Location provider:work or home office Persons participating in the virtual visit: patient, provider  I discussed the limitations of evaluation and management by telemedicine and the availability of in person appointments. The patient expressed understanding and agreed to proceed.   HPI: Pt following up on Migraines.  Seen by Dr. Elease Hashimoto 01/25/19, given rx for Maxalt and zofran.  Pt was supposed to stay out of work until tomorrow, but went in today.  Pt notes Maxalt helped, has only taken twice, but an hour later felt nauseous.  Has not picked up rx for zofran as she did not want to be sleepy at work.  Goody's powder helps, but pt tries not to take it.  Pt is caffeine sensitive.  Pt considering coming off depo. Next injection due in Oct.  Wonders if would cause less HAs.  Pt endorses increased stress.  Her brother was killed Aug 4th, she started classes in Aug (classes Tues and Thurs), and is working full time as a Marine scientist.  Pt states she is dealing with the stress, has "some good days and some bad days".  Pt tries not to talk about it as she gets emotional.  Pt was also stressed a few months ago as had presumably false positive RPR testing.  The confirmatory test were positive, but repeat testing at the [facility] department were negative.    Pt requesting a new referral to another Dermatologist.  Seen in the past by [facility] for anal and axillary pruritis, but her insurance changed.  Told had folliculitis at one point.   ROS: See pertinent positives and negatives per HPI.  No past medical history on file.  No past surgical history on file.  No family history on file.   Current Outpatient Medications:  .  cetirizine (ZYRTEC) 10 MG  tablet, Take 1 tablet (10 mg total) by mouth daily., Disp: 30 tablet, Rfl: 2 .  cyclobenzaprine (FLEXERIL) 5 MG tablet, Take 1 tablet (5 mg total) by mouth at bedtime as needed for muscle spasms. (Patient not taking: Reported on 12/02/2018), Disp: 30 tablet, Rfl: 0 .  fluconazole (DIFLUCAN) 150 MG tablet, Take 1 tab today and repeat after 3 days (Patient not taking: Reported on 12/02/2018), Disp: 2 tablet, Rfl: 0 .  ondansetron (ZOFRAN ODT) 4 MG disintegrating tablet, Take 1 tablet (4 mg total) by mouth every 8 (eight) hours as needed for nausea or vomiting., Disp: 20 tablet, Rfl: 0 .  rizatriptan (MAXALT-MLT) 10 MG disintegrating tablet, Take 1 tablet (10 mg total) by mouth as needed for migraine. May repeat in 2 hours if needed, Disp: 10 tablet, Rfl: 1 .  tizanidine (ZANAFLEX) 2 MG capsule, Take 1 capsule (2 mg total) by mouth 3 (three) times daily as needed for muscle spasms., Disp: 30 capsule, Rfl: 1 .  Vitamin D, Ergocalciferol, (DRISDOL) 1.25 MG (50000 UT) CAPS capsule, Take 1 capsule (50,000 Units total) by mouth every 7 (seven) days., Disp: 12 capsule, Rfl: 0 .  Vitamin D, Ergocalciferol, (DRISDOL) 1.25 MG (50000 UT) CAPS capsule, Take 1 capsule (50,000 Units total) by mouth every 7 (seven) days., Disp: 12 capsule, Rfl: 0  EXAM:  VITALS per patient if applicable:  GENERAL: alert, oriented, appears well and in no acute distress  HEENT:  atraumatic, conjunctiva clear, no obvious abnormalities on inspection of external nose and ears  NECK: normal movements of the head and neck  LUNGS: on inspection no signs of respiratory distress, breathing rate appears normal, no obvious gross SOB, gasping or wheezing  CV: no obvious cyanosis  MS: moves all visible extremities without noticeable abnormality  PSYCH/NEURO: pleasant and cooperative, no obvious depression or anxiety, speech and thought processing grossly intact  ASSESSMENT AND PLAN:  Discussed the following assessment and  plan:  Migraine without aura and without status migrainosus, not intractable -discussed HA prevention.  Stress likely a contributing factor. -Discussed medication options for HA ppx such as topamax or nortriptyline. Given low normal bp, would likely not tolerate propranolol. -continue maxalt prn.  Consider taking 1/2 of the zofran 4 mg tab to see if it makes her sleepy. -consider f/u with Neurology for continued symptoms.  Referral placed in Jan 2020. -given precautions  Stress -discussed ways to relieve stress. -consider counseling.  Advised no referral needed.  Birth control counseling  -pap up to date.  Done at [facility], along with repeat RPR testing. -will start OCPs in October - Plan: Norethindrone Acetate-Ethinyl Estrad-FE (LOESTRIN 24 FE) 1-20 MG-MCG(24) tablet  Pruritus  - Plan: Ambulatory referral to Dermatology  F/u prn in 1 month, sooner if needed.   I discussed the assessment and treatment plan with the patient. The patient was provided an opportunity to ask questions and all were answered. The patient agreed with the plan and demonstrated an understanding of the instructions.   The patient was advised to call back or seek an in-person evaluation if the symptoms worsen or if the condition fails to improve as anticipated.    [name], MD

## 2019-02-11 ENCOUNTER — Ambulatory Visit (HOSPITAL_COMMUNITY)
Admission: EM | Admit: 2019-02-11 | Discharge: 2019-02-11 | Disposition: A | Discharge status: Home / Self Care | Payer: No Typology Code available for payment source | Attending: Family Medicine | Admitting: Family Medicine

## 2019-02-11 ENCOUNTER — Encounter (HOSPITAL_COMMUNITY): Payer: Self-pay

## 2019-02-11 ENCOUNTER — Encounter: Payer: Self-pay | Admitting: Family Medicine

## 2019-02-11 ENCOUNTER — Other Ambulatory Visit: Payer: Self-pay

## 2019-02-11 DIAGNOSIS — Z0189 Encounter for other specified special examinations: Secondary | ICD-10-CM | POA: Diagnosis not present

## 2019-02-11 DIAGNOSIS — Z20828 Contact with and (suspected) exposure to other viral communicable diseases: Secondary | ICD-10-CM | POA: Insufficient documentation

## 2019-02-11 DIAGNOSIS — Z20822 Contact with and (suspected) exposure to covid-19: Secondary | ICD-10-CM

## 2019-02-11 NOTE — ED Notes (Signed)
Novel Coronavirus order completed and sent to main lab for processing.

## 2019-02-11 NOTE — ED Provider Notes (Signed)
[facility]    CSN: [id number] Arrival date & time: 02/11/19  1330      History   Chief Complaint Chief Complaint  Patient presents with  . covid test    HPI Kathy Cain is a 28 y.o. female.   HPI  Patient plans air travel over the weekend.  It is required that she have coronavirus testing before travel.  She feels well.  No symptoms.  No known exposure. History reviewed. No pertinent past medical history.  There are no active problems to display for this patient.   History reviewed. No pertinent surgical history.  OB History    Gravida  0   Para  0   Term  0   Preterm  0   AB  0   Living  0     SAB  0   TAB  0   Ectopic  0   Multiple  0   Live Births               Home Medications    Prior to Admission medications   Medication Sig Start Date End Date Taking? Authorizing Provider  cetirizine (ZYRTEC) 10 MG tablet Take 1 tablet (10 mg total) by mouth daily. 01/07/18   [name], MD  Norethindrone Acetate-Ethinyl Estrad-FE (LOESTRIN 24 FE) 1-20 MG-MCG(24) tablet Take 1 tablet by mouth daily. 02/02/19   [name], MD  ondansetron (ZOFRAN ODT) 4 MG disintegrating tablet Take 1 tablet (4 mg total) by mouth every 8 (eight) hours as needed for nausea or vomiting. 01/29/19   Burchette, Elberta Fortis, MD  rizatriptan (MAXALT-MLT) 10 MG disintegrating tablet Take 1 tablet (10 mg total) by mouth as needed for migraine. May repeat in 2 hours if needed 01/25/19   Burchette, Elberta Fortis, MD  Vitamin D, Ergocalciferol, (DRISDOL) 1.25 MG (50000 UT) CAPS capsule Take 1 capsule (50,000 Units total) by mouth every 7 (seven) days. 12/03/18   [name], MD    Family History History reviewed. No pertinent family history.  Social History Social History   Tobacco Use  . Smoking status: Never Smoker  . Smokeless tobacco: Never Used  Substance Use Topics  . Alcohol use: Yes    Comment: ocass.   . Drug use: No     Allergies    Patient has no known allergies.   Review of Systems Review of Systems  Constitutional: Negative for chills and fever.  HENT: Negative for ear pain and sore throat.   Eyes: Negative for pain and visual disturbance.  Respiratory: Negative for cough and shortness of breath.   Cardiovascular: Negative for chest pain and palpitations.  Gastrointestinal: Negative for abdominal pain and vomiting.  Genitourinary: Negative for dysuria and hematuria.  Musculoskeletal: Negative for arthralgias and back pain.  Skin: Negative for color change and rash.  Neurological: Negative for seizures and syncope.  All other systems reviewed and are negative.    Physical Exam Triage Vital Signs ED Triage Vitals [02/11/19 1421]  Enc Vitals Group     BP 121/68     Pulse Rate 95     Resp 16     Temp 97.9 F (36.6 C)     Temp Source Oral     SpO2 100 %     Weight 140 lb (63.5 kg)     Height      Head Circumference      Peak Flow      Pain Score 0  Pain Loc      Pain Edu?      Excl. in [place]?    No data found.  Updated Vital Signs BP 121/68 (BP Location: Right Arm)   Pulse 95   Temp 97.9 F (36.6 C) (Oral)   Resp 16   Wt 63.5 kg   SpO2 100%   BMI 24.80 kg/m   Visual Acuity Right Eye Distance:   Left Eye Distance:   Bilateral Distance:    Right Eye Near:   Left Eye Near:    Bilateral Near:     Physical Exam Constitutional:      General: She is not in acute distress.    Appearance: Normal appearance. She is well-developed and normal weight.  HENT:     Head: Normocephalic and atraumatic.  Eyes:     Conjunctiva/sclera: Conjunctivae normal.     Pupils: Pupils are equal, round, and reactive to light.  Neck:     Musculoskeletal: Normal range of motion.  Cardiovascular:     Rate and Rhythm: Normal rate.  Pulmonary:     Effort: Pulmonary effort is normal. No respiratory distress.  Abdominal:     General: There is no distension.     Palpations: Abdomen is soft.   Musculoskeletal: Normal range of motion.  Skin:    General: Skin is warm and dry.  Neurological:     Mental Status: She is alert.      UC Treatments / Results  Labs (all labs ordered are listed, but only abnormal results are displayed) Labs Reviewed  NOVEL CORONAVIRUS, NAA (HOSP ORDER, SEND-OUT TO REF LAB; TAT 18-24 HRS)    EKG   Radiology No results found.  Procedures Procedures (including critical care time)  Medications Ordered in UC Medications - No data to display  Initial Impression / Assessment and Plan / UC Course  I have reviewed the triage vital signs and the nursing notes.  Pertinent labs & imaging results that were available during my care of the patient were reviewed by me and considered in my medical decision making (see chart for details).     We reviewed the importance of quarantine pending coronavirus testing. Final Clinical Impressions(s) / UC Diagnoses   Final diagnoses:  Encounter for laboratory testing for COVID-19 virus     Discharge Instructions     The results will be available in 2 to 3 days If you sign into my chart your test will be sent directly to you     ED Prescriptions    None     PDMP not reviewed this encounter.   Raylene Everts, MD 02/11/19 [phone]

## 2019-02-11 NOTE — Discharge Instructions (Addendum)
The results will be available in 2 to 3 days If you sign into my chart your test will be sent directly to you

## 2019-02-11 NOTE — ED Triage Notes (Signed)
Pt states she's going out of the states Saturday and she needs a Covid test done.

## 2019-02-12 LAB — NOVEL CORONAVIRUS, NAA (HOSP ORDER, SEND-OUT TO REF LAB; TAT 18-24 HRS): SARS-CoV-2, NAA: NOT DETECTED

## 2019-02-17 ENCOUNTER — Other Ambulatory Visit: Payer: Self-pay | Admitting: Family Medicine

## 2019-02-17 MED ORDER — FLUCONAZOLE 150 MG PO TABS: 150.0000 mg | ORAL_TABLET | Freq: Once | ORAL | 0 refills | Status: AC

## 2019-04-07 ENCOUNTER — Other Ambulatory Visit (HOSPITAL_COMMUNITY)
Admission: RE | Admit: 2019-04-07 | Discharge: 2019-04-07 | Disposition: A | Discharge status: Home / Self Care | Payer: No Typology Code available for payment source | Source: Ambulatory Visit | Attending: Family Medicine | Admitting: Family Medicine

## 2019-04-07 ENCOUNTER — Ambulatory Visit (INDEPENDENT_AMBULATORY_CARE_PROVIDER_SITE_OTHER): Payer: No Typology Code available for payment source | Admitting: Family Medicine

## 2019-04-07 ENCOUNTER — Encounter: Payer: Self-pay | Admitting: Family Medicine

## 2019-04-07 ENCOUNTER — Other Ambulatory Visit: Payer: Self-pay

## 2019-04-07 VITALS — BP 104/78 | HR 95 | Temp 97.6°F | Wt 149.0 lb

## 2019-04-07 DIAGNOSIS — N760 Acute vaginitis: Secondary | ICD-10-CM

## 2019-04-07 NOTE — Progress Notes (Signed)
Subjective:    Patient ID: Kathy Cain, female    DOB: Jun 09, 1990, 28 y.o.   MRN: [id number]  No chief complaint on file.   HPI Patient was seen today for acute concern.  Pt with vaginal irritation and white d/c x 5 days. Pt tried OTC 1 day yeast treatment.  Thought symptoms were improving, but they returned.  Denies dysuria, abdominal pain, changes in partners, n/v.  Notes nocturia, but drinking more water during the day and cranberry juice.  Pt inquires about new referral to Neuro and Derm.  Requesting places in network.  Was referred to [facility] but they were out of network.  Pt still having issues with HAs, pruritis, and ingrown hairs/carbuncles in axilla and groin.  Pt otherwise doing ok.  Still working and in school.  All classes now online since Thanksgiving break.  No past medical history on file.  No Known Allergies  ROS General: Denies fever, chills, night sweats, changes in weight, changes in appetite HEENT: Denies headaches, ear pain, changes in vision, rhinorrhea, sore throat CV: Denies CP, palpitations, SOB, orthopnea Pulm: Denies SOB, cough, wheezing GI: Denies abdominal pain, nausea, vomiting, diarrhea, constipation GU: Denies dysuria, hematuria, frequency  +vaginal discharge and irritation Msk: Denies muscle cramps, joint pains Neuro: Denies weakness, numbness, tingling Skin: Denies rashes, bruising +pruritis, ingrown hairs, carbuncles Psych: Denies depression, anxiety, hallucinations     Objective:    Blood pressure 104/78, pulse 95, temperature 97.6 F (36.4 C), temperature source Temporal, weight 149 lb (67.6 kg), SpO2 98 %.   Gen. Pleasant, well-nourished, in no distress, normal affect   HEENT: [place]/AT, face symmetric, conjunctiva clear, no scleral icterus, PERRLA, nares patent without drainage Lungs: no accessory muscle use, CTAB, no wheezes or rales Cardiovascular: RRR, no peripheral edema GU: pt collected self swab Neuro:  A&Ox3, CN II-XII  intact, normal gait Skin:  Warm, no lesions/ rash   Wt Readings from Last 3 Encounters:  04/07/19 149 lb (67.6 kg)  02/11/19 140 lb (63.5 kg)  12/02/18 145 lb 3.2 oz (65.9 kg)    Lab Results  Component Value Date   WBC 4.6 12/02/2018   HGB 12.3 12/02/2018   HCT 38.3 12/02/2018   PLT 354.0 12/02/2018   GLUCOSE 89 12/02/2018   CHOL 134 12/02/2018   TRIG 42.0 12/02/2018   HDL 55.90 12/02/2018   LDLCALC 70 12/02/2018   ALT 11 12/02/2018   AST 13 12/02/2018   NA 142 12/02/2018   K 4.2 12/02/2018   CL 108 12/02/2018   CREATININE 0.99 12/02/2018   BUN 17 12/02/2018   CO2 28 12/02/2018   TSH 3.12 08/28/2017    Assessment/Plan:  Acute vaginitis  -discussed possible causes -obtained pt collected aptima swab  -will treat based on results -given precautions -continue Loestrin Fe OCPs - Plan: Cervicovaginal ancillary only  Will have referrals resent to in network providers.  F/u prn  Grier Mitts, MD

## 2019-04-08 ENCOUNTER — Telehealth: Payer: Self-pay | Admitting: Family Medicine

## 2019-04-08 LAB — CERVICOVAGINAL ANCILLARY ONLY
Bacterial Vaginitis (gardnerella): NEGATIVE
Candida Glabrata: NEGATIVE
Candida Vaginitis: POSITIVE — AB
Chlamydia: NEGATIVE
Comment: NEGATIVE
Comment: NEGATIVE
Comment: NEGATIVE
Comment: NEGATIVE
Comment: NEGATIVE
Comment: NORMAL
Neisseria Gonorrhea: NEGATIVE
Trichomonas: NEGATIVE

## 2019-04-08 NOTE — Telephone Encounter (Signed)
Copied from [place] [id number]. Topic: General - Inquiry >> Apr 08, 2019  4:12 PM Mathis Bud wrote: Reason for CRM: patient states she is requesting to get her swab results back. Call back [phone]

## 2019-04-09 ENCOUNTER — Other Ambulatory Visit: Payer: Self-pay | Admitting: Family Medicine

## 2019-04-09 DIAGNOSIS — B3731 Acute candidiasis of vulva and vagina: Secondary | ICD-10-CM

## 2019-04-09 DIAGNOSIS — B373 Candidiasis of vulva and vagina: Secondary | ICD-10-CM

## 2019-04-09 MED ORDER — FLUCONAZOLE 150 MG PO TABS: 150.0000 mg | ORAL_TABLET | Freq: Once | ORAL | 0 refills | Status: AC

## 2019-04-09 NOTE — Telephone Encounter (Signed)
Spoke with pt verbalized understanding that Dr Volanda Napoleon has not reviewed the results,advised the office will call her with the results

## 2019-04-13 ENCOUNTER — Other Ambulatory Visit: Payer: Self-pay

## 2019-04-13 DIAGNOSIS — R519 Headache, unspecified: Secondary | ICD-10-CM

## 2019-05-05 ENCOUNTER — Encounter: Payer: Self-pay | Admitting: Family Medicine

## 2019-05-05 NOTE — Telephone Encounter (Signed)
I called the pt as Dr Volanda Napoleon and Izora Gala are seeing patients currently and she stated she has had recurrent vaginal discharge and irritation since yesterday.  Stated she was seen 2 weeks ago for the same symptoms which occur after her cycle ends (which was 2 days ago and since starting the birth control pills) and questioned if Diflucan could be called in to her pharmacy?  Patient denies having intercourse so she stated she does not feel this could be related.  Message sent to Dr Volanda Napoleon.

## 2019-05-11 ENCOUNTER — Encounter: Payer: Self-pay | Admitting: Family Medicine

## 2019-05-14 ENCOUNTER — Telehealth (INDEPENDENT_AMBULATORY_CARE_PROVIDER_SITE_OTHER): Payer: No Typology Code available for payment source | Admitting: Family Medicine

## 2019-05-14 DIAGNOSIS — Z30016 Encounter for initial prescription of transdermal patch hormonal contraceptive device: Secondary | ICD-10-CM | POA: Diagnosis not present

## 2019-05-14 DIAGNOSIS — N898 Other specified noninflammatory disorders of vagina: Secondary | ICD-10-CM | POA: Diagnosis not present

## 2019-05-14 DIAGNOSIS — G43009 Migraine without aura, not intractable, without status migrainosus: Secondary | ICD-10-CM

## 2019-05-14 MED ORDER — FLUCONAZOLE 150 MG PO TABS: ORAL_TABLET | ORAL | 3 refills | Status: DC

## 2019-05-14 MED ORDER — NORELGESTROMIN-ETH ESTRADIOL 150-35 MCG/24HR TD PTWK: 1.0000 | MEDICATED_PATCH | TRANSDERMAL | 12 refills | Status: DC

## 2019-05-14 NOTE — Progress Notes (Signed)
Virtual Visit via Video Note  I connected with Kathy Cain on 05/14/19 at  9:00 AM EST by a video enabled telemedicine application 2/2 COVID-19 pandemic and verified that I am speaking with the correct person using two identifiers.  Location patient: home Location provider:work or home office Persons participating in the virtual visit: patient, provider  I discussed the limitations of evaluation and management by telemedicine and the availability of in person appointments. The patient expressed understanding and agreed to proceed.   HPI: Pt is a 29 yo female with no sig pmh.  Pt states she has been forgetting to take OCPs daily.  Notes the pill is making her migraines worse, especially when she forgets pills.  She has a neurology appt on 1/28.   Pt also notes yeast infections and increased vaginal d/c after menses.  In the past pt was on Depo without issue.  ROS: See pertinent positives and negatives per HPI.  No past medical history on file.  No past surgical history on file.  No family history on file.  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   Current Outpatient Medications:  .  cetirizine (ZYRTEC) 10 MG tablet, Take 1 tablet (10 mg total) by mouth daily., Disp: 30 tablet, Rfl: 2 .  Norethindrone Acetate-Ethinyl Estrad-FE (LOESTRIN 24 FE) 1-20 MG-MCG(24) tablet, Take 1 tablet by mouth daily., Disp: 1 Package, Rfl: 11 .  rizatriptan (MAXALT-MLT) 10 MG disintegrating tablet, Take 1 tablet (10 mg total) by mouth as needed for migraine. May repeat in 2 hours if needed, Disp: 10 tablet, Rfl: 1  EXAM:  VITALS per patient if applicable:  RR between 12-20 bpm.  GENERAL: alert, oriented, appears well and in no acute distress  HEENT: atraumatic, conjunctiva clear, no obvious abnormalities on inspection of external nose and ears  NECK: normal movements of the head and neck  LUNGS: on inspection no signs of respiratory distress, breathing rate appears normal, no obvious gross SOB,  gasping or wheezing  CV: no obvious cyanosis  MS: moves all visible extremities without noticeable abnormality  PSYCH/NEURO: pleasant and cooperative, no obvious depression or anxiety, speech and thought processing grossly intact  ASSESSMENT AND PLAN:  Discussed the following assessment and plan:  Encounter for initial prescription of transdermal patch hormonal contraceptive device  -will d/c Lo loestrin OCPs - Plan: norelgestromin-ethinyl estradiol (ORTHO EVRA) 150-35 MCG/24HR transdermal patch  Vaginal discharge  -likely physiologic s/p menses -will send in rx for diflucan to be used if irritation occurs. - Plan: fluconazole (DIFLUCAN) 150 MG tablet  Migraine without aura and without status migrainosus, not intractable -increasing -continue Maxalt prn -hydration encouraged, limit caffeine, get plenty of rest -f/u with Neurology   f/u prn  I discussed the assessment and treatment plan with the patient. The patient was provided an opportunity to ask questions and all were answered. The patient agreed with the plan and demonstrated an understanding of the instructions.   The patient was advised to call back or seek an in-person evaluation if the symptoms worsen or if the condition fails to improve as anticipated.  [name], MD

## 2019-05-31 ENCOUNTER — Encounter: Payer: Self-pay | Admitting: Neurology

## 2019-06-01 NOTE — Progress Notes (Signed)
Virtual Visit via Video Note The purpose of this virtual visit is to provide medical care while limiting exposure to the novel coronavirus.    Consent was obtained for video visit:  Yes.   Answered questions that patient had about telehealth interaction:  Yes.   I discussed the limitations, risks, security and privacy concerns of performing an evaluation and management service by telemedicine. I also discussed with the patient that there may be a patient responsible charge related to this service. The patient expressed understanding and agreed to proceed.  Pt location: Home Physician Location: office Name of referring provider:  [name], MD I connected with Kathy Cain at patients initiation/request on 06/02/2019 at  9:10 AM EST by video enabled telemedicine application and verified that I am speaking with the correct person using two identifiers. Pt MRN:  [id number] Pt DOB:  April 15, 1991 Video Participants:  Kathy Cain   History of Present Illness:  Kathy Cain is a 29 year old female who presents for headaches.  History supplemented by referring provider note.  Onset:  Mid-20s  Wisdom teeth.  She stopped her Depo shot in October to see if it was contributing to the migraines but migraines became a little more intense.  In addition, she has a near daily dull diffuse headache. Location:  Either side or frontal, rarely occipital Quality:  pounding Intensity:  moderate.  She denies new headache, thunderclap headache or severe headache that wakes her from sleep. Aura:  no Premonitory Phase:  Preceding dull headache Postdrome:  no Associated symptoms:  Nausea, photophobia, phonophobia, slight lightheadedness.  She denies associated vomiting, osmophobia, visual disturbance, unilateral numbness or weakness. Duration:  2-3 days Frequency:  3 migraines (total over 15 headache days including near daily dull diffuse headache) Frequency of abortive medication:  takes Goody powder almost daily Triggers:  unknown Relieving factors:  Cool dark room Activity:  aggravates   Rescue therapy:  Goody powder (helpful) Current NSAIDS:  none Current analgesics:  Goody powder, Tylenol 500mg  Current triptans:  Rizatriptan 10mg  (ineffective) Current ergotamine:  none Current anti-emetic:  none Current muscle relaxants:  none Current anti-anxiolytic:  none Current sleep aide:  melatonin Current Antihypertensive medications:  none Current Antidepressant medications:  none Current Anticonvulsant medications:  none Current anti-CGRP:  none Current Vitamins/Herbal/Supplements:  melatonin Current Antihistamines/Decongestants:  Zyrtec Other therapy:  none Hormone/birth control:  Ortho Evra  Past NSAIDS:  Toradol shot, ibuprofen Past analgesics:  Excedrin (can't take due to caffeine intolerance) Past abortive triptans:  none Past abortive ergotamine:  none Past muscle relaxants:  Tizanidine 2mg  Past anti-emetic:  Zofran ODT 4mg  Past antihypertensive medications:  None (runs baseline low blood pressure 90s/60s)3 Past antidepressant medications:  none Past anticonvulsant medications:  topiramate 50mg  twice daily Past anti-CGRP:  none Past vitamins/Herbal/Supplements:  D Past antihistamines/decongestants:  none Other past therapies:  none  Caffeine:  none Hydration:  Three 16oz water bottles daily. Exercise:  no Depression:  no; Anxiety:  no Other pain:  no Sleep hygiene:  Okay.  Takes melatonin Family history of headache:  Grandmother (possibly related to TIAs)  12/02/2018 LABS:  CBC with WBC 4.6, HGB 12.3, HCT 38.3, PLT 354; CMP with Na 142, K 4.2, Cl 108, CO2 28, glucose 89, BUN 17, Cr 0.99, t bili 0.5, ALP 62, AST 13, ALT 11.   Past Medical History: No past medical history on file.  Medications: Outpatient Encounter Medications as of 06/02/2019  Medication Sig  . cetirizine (ZYRTEC) 10 MG tablet  Take 1 tablet (10 mg total) by mouth daily.  .  norelgestromin-ethinyl estradiol (ORTHO EVRA) 150-35 MCG/24HR transdermal patch Place 1 patch onto the skin once a week.  . rizatriptan (MAXALT-MLT) 10 MG disintegrating tablet Take 1 tablet (10 mg total) by mouth as needed for migraine. May repeat in 2 hours if needed  . fluconazole (DIFLUCAN) 150 MG tablet Take one pill now.  Repeat dose in 3 days for continued symptoms. (Patient not taking: Reported on 05/31/2019)   No facility-administered encounter medications on file as of 06/02/2019.    Allergies: No Known Allergies  Family History: History reviewed. No pertinent family history.  Social History: Social History   Socioeconomic History  . Marital status: Single    Spouse name: Not on file  . Number of children: 0  . Years of education: Not on file  . Highest education level: Some college, no degree  Occupational History  . Occupation: [employer]   Tobacco Use  . Smoking status: Never Smoker  . Smokeless tobacco: Never Used  Substance and Sexual Activity  . Alcohol use: Yes    Comment: ocass.   . Drug use: No  . Sexual activity: Yes    Birth control/protection: Injection  Other Topics Concern  . Not on file  Social History Narrative  . Not on file   Social Determinants of Health   Financial Resource Strain:   . Difficulty of Paying Living Expenses: Not on file  Food Insecurity:   . Worried About Programme researcher, broadcasting/film/video in the Last Year: Not on file  . Ran Out of Food in the Last Year: Not on file  Transportation Needs:   . Lack of Transportation (Medical): Not on file  . Lack of Transportation (Non-Medical): Not on file  Physical Activity:   . Days of Exercise per Week: Not on file  . Minutes of Exercise per Session: Not on file  Stress:   . Feeling of Stress : Not on file  Social Connections:   . Frequency of Communication with Friends and Family: Not on file  . Frequency of Social Gatherings with Friends and Family: Not on file  . Attends Religious Services: Not on  file  . Active Member of Clubs or Organizations: Not on file  . Attends Banker Meetings: Not on file  . Marital Status: Not on file  Intimate Partner Violence:   . Fear of Current or Ex-Partner: Not on file  . Emotionally Abused: Not on file  . Physically Abused: Not on file  . Sexually Abused: Not on file    Observations/Objective:   Height 5\' 2"  (1.575 m), weight 145 lb (65.8 kg). No acute distress.  Alert and oriented.  Speech fluent and not dysarthric.  Language intact.  Eyes orthophoric on primary gaze.  Face symmetric.  Assessment and Plan:   Chronic migraine without aura, without status migrainosus, not intractable, complicated by medication overuse  1.  For preventative management, start Aimovig 70mg  every month.  Failed topiramate.  Beta blocker contraindicated due to baseline hypotension. 2.  STOP MAXALT AND GOODY POWDER.  For abortive therapy, sumatriptan 100mg  3.  Limit use of pain relievers to no more than 2 days out of week to prevent risk of rebound or medication-overuse headache. 4.  Keep headache diary 5.  Exercise, hydration, caffeine cessation, sleep hygiene, monitor for and avoid triggers 6.  Follow up 4 months   Follow Up Instructions:    -I discussed the assessment and treatment plan with  the patient. The patient was provided an opportunity to ask questions and all were answered. The patient agreed with the plan and demonstrated an understanding of the instructions.   The patient was advised to call back or seek an in-person evaluation if the symptoms worsen or if the condition fails to improve as anticipated.   Dudley Major, DO

## 2019-06-02 ENCOUNTER — Encounter: Payer: Self-pay | Admitting: Neurology

## 2019-06-02 ENCOUNTER — Telehealth (INDEPENDENT_AMBULATORY_CARE_PROVIDER_SITE_OTHER): Payer: No Typology Code available for payment source | Admitting: Neurology

## 2019-06-02 ENCOUNTER — Other Ambulatory Visit: Payer: Self-pay

## 2019-06-02 VITALS — Ht 62.0 in | Wt 145.0 lb

## 2019-06-02 DIAGNOSIS — G43719 Chronic migraine without aura, intractable, without status migrainosus: Secondary | ICD-10-CM

## 2019-06-02 MED ORDER — SUMATRIPTAN SUCCINATE 100 MG PO TABS: ORAL_TABLET | ORAL | 3 refills | Status: DC

## 2019-06-02 MED ORDER — AIMOVIG 70 MG/ML ~~LOC~~ SOAJ: 70.0000 mg | SUBCUTANEOUS | 11 refills | Status: DC

## 2019-06-02 NOTE — Patient Instructions (Signed)
Migraine Recommendations: 1.  Start Aimovig 70mg  injection every 30 days.  Go to [email or web address] to sign up and activate a copay card.  Ask pharmacist how to administer it. 2.  STOP RIZATRIPTAN AND GOODY POWDER.  Take sumatriptan 100mg  at earliest onset of headache.  May repeat dose once in 2 hours if needed.  Do not exceed two tablets in 24 hours. 3.  Limit use of pain relievers to no more than 2 days out of the week.  These medications include acetaminophen, ibuprofen, triptans and narcotics.  This will help reduce risk of rebound headaches. 4.  Be aware of common food triggers such as processed sweets, processed foods with nitrites (such as deli meat, hot dogs, sausages), foods with MSG, alcohol (such as wine), chocolate, certain cheeses, certain fruits (dried fruits, bananas, pineapple), vinegar, diet soda. 4.  Avoid caffeine 5.  Routine exercise 6.  Proper sleep hygiene 7.  Stay adequately hydrated with water 8.  Keep a headache diary. 9.  Maintain proper stress management. 10.  Do not skip meals. 11.  Consider supplements:  Magnesium citrate 400mg  to 600mg  daily, riboflavin 400mg , Coenzyme Q 10 100mg  three times daily 12.  Follow up in 4 months.

## 2019-06-17 ENCOUNTER — Telehealth: Payer: Self-pay | Admitting: Family Medicine

## 2019-06-17 NOTE — Telephone Encounter (Signed)
Pt has a yeast infection and would like medication sent in for it if possible.   Pharmacy: [facility]

## 2019-06-18 NOTE — Telephone Encounter (Signed)
Spoke with pt advised to pick up her refill for Diflucan from her pharmacy, pt verbalized understanding

## 2019-07-29 ENCOUNTER — Telehealth: Payer: Self-pay | Admitting: Family Medicine

## 2019-07-29 NOTE — Telephone Encounter (Signed)
Pt has had a migraine for 5 days. Pt is starting to feel sick and very nauseas. Pt would like to speak to you regarding if it's possible for her to receive a vaccine to help with the migraine. Thanks

## 2019-07-30 NOTE — Telephone Encounter (Signed)
Left a message for pt to call office and schedule a virtual appointment regarding her migraine

## 2019-08-03 NOTE — Telephone Encounter (Signed)
Send a message on MyChart for pt to call office and schedule a MyChart visit

## 2019-09-23 ENCOUNTER — Ambulatory Visit: Payer: Self-pay

## 2019-09-23 ENCOUNTER — Ambulatory Visit: Payer: Self-pay | Admitting: Advanced Practice Midwife

## 2019-09-23 ENCOUNTER — Encounter: Payer: Self-pay | Admitting: Advanced Practice Midwife

## 2019-09-23 ENCOUNTER — Other Ambulatory Visit: Payer: Self-pay

## 2019-09-23 DIAGNOSIS — B9689 Other specified bacterial agents as the cause of diseases classified elsewhere: Secondary | ICD-10-CM

## 2019-09-23 DIAGNOSIS — N760 Acute vaginitis: Secondary | ICD-10-CM

## 2019-09-23 DIAGNOSIS — Z113 Encounter for screening for infections with a predominantly sexual mode of transmission: Secondary | ICD-10-CM

## 2019-09-23 LAB — WET PREP FOR TRICH, YEAST, CLUE
Trichomonas Exam: NEGATIVE
Yeast Exam: NEGATIVE

## 2019-09-23 MED ORDER — METRONIDAZOLE 500 MG PO TABS: 500.0000 mg | ORAL_TABLET | Freq: Two times a day (BID) | ORAL | 0 refills | Status: AC

## 2019-09-23 NOTE — Progress Notes (Signed)
Here for STD testing..Katie Brewer-Jensen, RN  

## 2019-09-23 NOTE — Progress Notes (Signed)
Wet mount reviewed, patient treated for BV per SO..Katie Brewer-Jensen, RN  

## 2019-09-23 NOTE — Progress Notes (Signed)
[facility] Department STI clinic/screening visit  Subjective:  Kathy Cain is a 29 y.o.SBF nullip nonsmoker female being seen today for an STI screening visit. The patient reports they do have symptoms.  Patient reports that they do not desire a pregnancy in the next year.   They reported they are not interested in discussing contraception today.  Patient's last menstrual period was 08/13/2019 (exact date).   Patient has the following medical conditions:  There are no problems to display for this patient.   Chief Complaint  Patient presents with  . Exposure to STD    HPI Patient reports LMP 08/13/19.  Last sex 09/14/19 with condom.  Last ETOH 09/17/19 (3 glasses wine) 1x/wk.  Last MJ 6 years ago.  C/o white d/c x few days with malodor since yesterday See flowsheet for further details and programmatic requirements.    The following portions of the patient's history were reviewed and updated as appropriate: allergies, current medications, past medical history, past social history, past surgical history and problem list.  Objective:  There were no vitals filed for this visit.  Physical Exam Vitals and nursing note reviewed.  Constitutional:      Appearance: Normal appearance.  HENT:     Head: Normocephalic and atraumatic.     Mouth/Throat:     Mouth: Mucous membranes are moist.     Pharynx: Oropharynx is clear. No oropharyngeal exudate or posterior oropharyngeal erythema.  Eyes:     Conjunctiva/sclera: Conjunctivae normal.  Pulmonary:     Effort: Pulmonary effort is normal.     Breath sounds: Normal breath sounds and air entry.  Abdominal:     General: Abdomen is flat.     Palpations: Abdomen is soft. There is no mass.     Tenderness: There is no abdominal tenderness. There is no rebound.  Genitourinary:    General: Normal vulva.     Exam position: Lithotomy position.     Pubic Area: No rash or pubic lice.      Labia:        Right: No rash or lesion.         Left: No rash or lesion.      Vagina: Normal. No vaginal discharge (white creamy leukorrhea, ph<4.5), erythema, bleeding or lesions.     Cervix: Normal.     Uterus: Normal.      Adnexa: Right adnexa normal and left adnexa normal.     Rectum: Normal.     Comments: Labia hypopigmented; pt states bathes 3x/day with hot water and has vulvar itching Lymphadenopathy:     Head:     Right side of head: No preauricular or posterior auricular adenopathy.     Left side of head: No preauricular or posterior auricular adenopathy.     Cervical: No cervical adenopathy.     Upper Body:     Right upper body: No supraclavicular or axillary adenopathy.     Left upper body: No supraclavicular or axillary adenopathy.     Lower Body: No right inguinal adenopathy. No left inguinal adenopathy.  Skin:    General: Skin is warm and dry.     Findings: No rash.  Neurological:     Mental Status: She is alert and oriented to person, place, and time.  Psychiatric:        Mood and Affect: Mood normal.      Assessment and Plan:  Kathy Cain is a 29 y.o. female presenting to the [facility] Department  for STI screening  1. Screening examination for venereal disease Treat wet mount per standing orders Immunization nurse consult - WET PREP FOR [place], YEAST, CLUE - Syphilis Serology, [facility] - HIV [facility] - Chlamydia/Gonorrhea [facility]     No follow-ups on file.  Future Appointments  Date Time Provider [place]  09/30/2019 10:10 AM Pieter Partridge, DO [facility] None    Herbie Saxon, CNM

## 2019-09-28 LAB — GONOCOCCUS CULTURE

## 2019-09-29 NOTE — Progress Notes (Deleted)
NEUROLOGY FOLLOW UP OFFICE NOTE  Kathy Cain [id number]  HISTORY OF PRESENT ILLNESS: Kathy Cain is a 29 year old female who follows up for migraines.  UPDATE: Started Aimovig in January.  Intensity:  *** Duration:  *** Frequency:  *** Frequency of abortive medication: ***   Current NSAIDS:  none Current analgesics:  Goody powder, Tylenol 500mg  Current triptans:  sumatriptan 100mg  Current ergotamine:  none Current anti-emetic:  none Current muscle relaxants:  none Current anti-anxiolytic:  none Current sleep aide:  melatonin Current Antihypertensive medications:  none Current Antidepressant medications:  none Current Anticonvulsant medications:  none Current anti-CGRP:  Aimovig 70mg  Current Vitamins/Herbal/Supplements:  melatonin Current Antihistamines/Decongestants:  Zyrtec Other therapy:  none Hormone/birth control:  Ortho Evra   Caffeine:  none Hydration:  Three 16oz water bottles daily. Exercise:  no Depression:  no; Anxiety:  no Other pain:  no Sleep hygiene:  Okay.  Takes melatonin  HISTORY: Onset:  Mid-20s  Wisdom teeth.  She stopped her Depo shot in October to see if it was contributing to the migraines but migraines became a little more intense.  In addition, she has a near daily dull diffuse headache. Location:  Either side or frontal, rarely occipital Quality:  pounding Initial intensity:  moderate.  She denies new headache, thunderclap headache or severe headache that wakes her from sleep. Aura:  no Premonitory Phase:  Preceding dull headache Postdrome:  no Associated symptoms:  Nausea, photophobia, phonophobia, slight lightheadedness.  She denies associated vomiting, osmophobia, visual disturbance, unilateral numbness or weakness. Initial duration:  2-3 days Initial frequency:  3 migraines (total over 15 headache days including near daily dull diffuse headache) Initial frequency of abortive medication: takes Goody powder almost  daily Triggers:  unknown Relieving factors:  Cool dark room Activity:  aggravates   Past NSAIDS:  Toradol shot, ibuprofen Past analgesics:  Excedrin; Goody powder Past abortive triptans:  Maxalt  Past abortive ergotamine:  none Past muscle relaxants:  Tizanidine 2mg  Past anti-emetic:  Zofran ODT 4mg  Past antihypertensive medications:  None (runs baseline low blood pressure 90s/60s) Past antidepressant medications:  none Past anticonvulsant medications:  topiramate 50mg  twice daily Past anti-CGRP:  none Past vitamins/Herbal/Supplements:  D Past antihistamines/decongestants:  none Other past therapies:  none   Family history of headache:  Grandmother (possibly related to TIAs)  PAST MEDICAL HISTORY: No past medical history on file.  MEDICATIONS: Current Outpatient Medications on File Prior to Visit  Medication Sig Dispense Refill  . cetirizine (ZYRTEC) 10 MG tablet Take 1 tablet (10 mg total) by mouth daily. 30 tablet 2  . Erenumab-aooe (AIMOVIG) 70 MG/ML SOAJ Inject 70 mg into the skin every 30 (thirty) days. 1 pen 11  . fluconazole (DIFLUCAN) 150 MG tablet Take one pill now.  Repeat dose in 3 days for continued symptoms. (Patient not taking: Reported on 05/31/2019) 2 tablet 3  . metroNIDAZOLE (FLAGYL) 500 MG tablet Take 1 tablet (500 mg total) by mouth 2 (two) times daily for 7 days. 14 tablet 0  . norelgestromin-ethinyl estradiol (ORTHO EVRA) 150-35 MCG/24HR transdermal patch Place 1 patch onto the skin once a week. 3 patch 12  . rizatriptan (MAXALT-MLT) 10 MG disintegrating tablet Take 1 tablet (10 mg total) by mouth as needed for migraine. May repeat in 2 hours if needed 10 tablet 1  . SUMAtriptan (IMITREX) 100 MG tablet Take 1 tablet earliest onset of migraine.  May repeat in 2 hours if headache persists or recurs.  Maximum 2 tablets in 24  hours. 10 tablet 3   No current facility-administered medications on file prior to visit.    ALLERGIES: No Known Allergies  FAMILY  HISTORY: No family history on file. .  SOCIAL HISTORY: Social History   Socioeconomic History  . Marital status: Single    Spouse name: Not on file  . Number of children: 0  . Years of education: Not on file  . Highest education level: Some college, no degree  Occupational History  . Occupation: cone   Tobacco Use  . Smoking status: Never Smoker  . Smokeless tobacco: Never Used  Substance and Sexual Activity  . Alcohol use: Yes    Comment: ocass.   . Drug use: No  . Sexual activity: Yes    Birth control/protection: Injection  Other Topics Concern  . Not on file  Social History Narrative  . Not on file   Social Determinants of Health   Financial Resource Strain:   . Difficulty of Paying Living Expenses:   Food Insecurity:   . Worried About Programme researcher, broadcasting/film/video in the Last Year:   . Barista in the Last Year:   Transportation Needs:   . Freight forwarder (Medical):   Marland Kitchen Lack of Transportation (Non-Medical):   Physical Activity:   . Days of Exercise per Week:   . Minutes of Exercise per Session:   Stress:   . Feeling of Stress :   Social Connections:   . Frequency of Communication with Friends and Family:   . Frequency of Social Gatherings with Friends and Family:   . Attends Religious Services:   . Active Member of Clubs or Organizations:   . Attends Banker Meetings:   Marland Kitchen Marital Status:   Intimate Partner Violence:   . Fear of Current or Ex-Partner:   . Emotionally Abused:   Marland Kitchen Physically Abused:   . Sexually Abused:     REVIEW OF SYSTEMS: Constitutional: No fevers, chills, or sweats, no generalized fatigue, change in appetite Eyes: No visual changes, double vision, eye pain Ear, nose and throat: No hearing loss, ear pain, nasal congestion, sore throat Cardiovascular: No chest pain, palpitations Respiratory:  No shortness of breath at rest or with exertion, wheezes GastrointestinaI: No nausea, vomiting, diarrhea, abdominal pain,  fecal incontinence Genitourinary:  No dysuria, urinary retention or frequency Musculoskeletal:  No neck pain, back pain Integumentary: No rash, pruritus, skin lesions Neurological: as above Psychiatric: No depression, insomnia, anxiety Endocrine: No palpitations, fatigue, diaphoresis, mood swings, change in appetite, change in weight, increased thirst Hematologic/Lymphatic:  No purpura, petechiae. Allergic/Immunologic: no itchy/runny eyes, nasal congestion, recent allergic reactions, rashes  PHYSICAL EXAM: *** General: No acute distress.  Patient appears well-groomed.   Head:  Normocephalic/atraumatic Eyes:  Fundi examined but not visualized Neck: supple, no paraspinal tenderness, full range of motion Heart:  Regular rate and rhythm Lungs:  Clear to auscultation bilaterally Back: No paraspinal tenderness Neurological Exam: alert and oriented to person, place, and time. Attention span and concentration intact, recent and remote memory intact, fund of knowledge intact.  Speech fluent and not dysarthric, language intact.  CN II-XII intact. Bulk and tone normal, muscle strength 5/5 throughout.  Sensation to light touch, temperature and vibration intact.  Deep tendon reflexes 2+ throughout, toes downgoing.  Finger to nose and heel to shin testing intact.  Gait normal, Romberg negative.  IMPRESSION: Migraine without aura, without status migrainosus, not intractable  PLAN: 1.  For preventative management, *** 2.  For abortive therapy, ***  3.  Limit use of pain relievers to no more than 2 days out of week to prevent risk of rebound or medication-overuse headache. 4.  Keep headache diary 5.  Exercise, hydration, caffeine cessation, sleep hygiene, monitor for and avoid triggers 6.  Follow up ***   Metta Clines, DO  CC: Grier Mitts, MD

## 2019-09-30 ENCOUNTER — Ambulatory Visit: Payer: No Typology Code available for payment source | Admitting: Neurology

## 2019-10-11 ENCOUNTER — Other Ambulatory Visit: Payer: Self-pay

## 2019-10-11 ENCOUNTER — Ambulatory Visit (LOCAL_COMMUNITY_HEALTH_CENTER): Payer: Self-pay

## 2019-10-11 VITALS — BP 96/68 | Ht 62.0 in | Wt 142.0 lb

## 2019-10-11 DIAGNOSIS — Z3201 Encounter for pregnancy test, result positive: Secondary | ICD-10-CM

## 2019-10-11 LAB — PREGNANCY, URINE: Preg Test, Ur: POSITIVE — AB

## 2019-10-15 ENCOUNTER — Other Ambulatory Visit: Payer: Self-pay | Admitting: Family Medicine

## 2019-10-15 DIAGNOSIS — N898 Other specified noninflammatory disorders of vagina: Secondary | ICD-10-CM

## 2019-10-18 NOTE — Telephone Encounter (Signed)
Left message to return phone call to schedule appt.  

## 2019-10-19 NOTE — Telephone Encounter (Signed)
Left  a voicemail for pt to call the office and schedule an office visit, MyChart message sent to pt advising to call office and schedule appointment

## 2019-12-06 MED FILL — CEPHALEXIN 500 MG CAPSULE: 500 | 5 days supply | Qty: 10 | Fill #0

## 2020-01-06 ENCOUNTER — Other Ambulatory Visit: Payer: Self-pay | Admitting: Family Medicine

## 2020-01-06 ENCOUNTER — Ambulatory Visit (INDEPENDENT_AMBULATORY_CARE_PROVIDER_SITE_OTHER): Payer: BLUE CROSS/BLUE SHIELD | Admitting: Family Medicine

## 2020-01-06 ENCOUNTER — Encounter: Payer: Self-pay | Admitting: Family Medicine

## 2020-01-06 ENCOUNTER — Other Ambulatory Visit: Payer: Self-pay

## 2020-01-06 ENCOUNTER — Other Ambulatory Visit (HOSPITAL_COMMUNITY)
Admission: RE | Admit: 2020-01-06 | Discharge: 2020-01-06 | Disposition: A | Discharge status: Home / Self Care | Payer: BLUE CROSS/BLUE SHIELD | Source: Ambulatory Visit | Attending: Family Medicine | Admitting: Family Medicine

## 2020-01-06 VITALS — BP 102/76 | HR 80 | Temp 98.9°F | Ht 62.0 in | Wt 143.0 lb

## 2020-01-06 DIAGNOSIS — Z3A22 22 weeks gestation of pregnancy: Secondary | ICD-10-CM

## 2020-01-06 DIAGNOSIS — N898 Other specified noninflammatory disorders of vagina: Secondary | ICD-10-CM | POA: Insufficient documentation

## 2020-01-06 DIAGNOSIS — Z0000 Encounter for general adult medical examination without abnormal findings: Secondary | ICD-10-CM | POA: Diagnosis not present

## 2020-01-06 LAB — POCT URINALYSIS DIPSTICK
Bilirubin, UA: NEGATIVE
Blood, UA: NEGATIVE
Glucose, UA: NEGATIVE
Ketones, UA: NEGATIVE
Nitrite, UA: NEGATIVE
Protein, UA: POSITIVE — AB
Spec Grav, UA: 1.015 (ref 1.010–1.025)
Urobilinogen, UA: 0.2 E.U./dL
pH, UA: 7.5 (ref 5.0–8.0)

## 2020-01-06 NOTE — Progress Notes (Signed)
Subjective:     Kathy Cain is a 29 y.o. female and is here for a comprehensive physical exam. The patient reports problems - vaginal d/c.  Patient states she has been doing well.  Patient currently pregnant.  LMP 08/08/2019.  Receiving prenatal care in [place].  Patient states she will be moving back to the [place] area in the next few months and is interested in an OB/GYN here.  Patient denies LE edema, nausea, vomiting, back pain.  States feels baby move.  Patient does note vaginal irritation and discharge.  Denies suprapubic pain, nausea, vomiting, low back pain, fever, chills drinking lots of water daily.  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  Social History   Socioeconomic History  . Marital status: Single    Spouse name: Not on file  . Number of children: 0  . Years of education: Not on file  . Highest education level: Some college, no degree  Occupational History  . Occupation: cone   Tobacco Use  . Smoking status: Never Smoker  . Smokeless tobacco: Never Used  Vaping Use  . Vaping Use: Never used  Substance and Sexual Activity  . Alcohol use: Not Currently    Comment: ocass.   . Drug use: Never  . Sexual activity: Yes    Birth control/protection: None    Comment: depo x 11 yrs. stopped depo 02/2019  Other Topics Concern  . Not on file  Social History Narrative  . Not on file   Social Determinants of Health   Financial Resource Strain:   . Difficulty of Paying Living Expenses: Not on file  Food Insecurity:   . Worried About Programme researcher, broadcasting/film/video in the Last Year: Not on file  . Ran Out of Food in the Last Year: Not on file  Transportation Needs:   . Lack of Transportation (Medical): Not on file  . Lack of Transportation (Non-Medical): Not on file  Physical Activity:   . Days of Exercise per Week: Not on file  . Minutes of Exercise per Session: Not on file  Stress:   . Feeling of Stress : Not on file  Social Connections:   .  Frequency of Communication with Friends and Family: Not on file  . Frequency of Social Gatherings with Friends and Family: Not on file  . Attends Religious Services: Not on file  . Active Member of Clubs or Organizations: Not on file  . Attends Banker Meetings: Not on file  . Marital Status: Not on file  Intimate Partner Violence: Not At Risk  . Fear of Current or Ex-Partner: No  . Emotionally Abused: No  . Physically Abused: No  . Sexually Abused: No   Health Maintenance  Topic Date Due  . Hepatitis C Screening  Never done  . COVID-19 Vaccine (1) Never done  . TETANUS/TDAP  Never done  . INFLUENZA VACCINE  Never done  . PAP-Cervical Cytology Screening  08/28/2020  . PAP SMEAR-Modifier  08/28/2020  . HIV Screening  Completed    The following portions of the patient's history were reviewed and updated as appropriate: allergies, current medications, past family history, past medical history, past social history, past surgical history and problem list.  Review of Systems Pertinent items noted in HPI and remainder of comprehensive ROS otherwise negative.   Objective:    BP 102/76 (BP Location: Left Arm, Patient Position: Sitting, Cuff Size: Normal)   Pulse 80   Temp 98.9 F (  37.2 C) (Oral)   Ht 5\' 2"  (1.575 m)   Wt 143 lb (64.9 kg)   LMP 08/13/2019 (Exact Date) Comment: shorter in duration, last normal mense 07/18/19  SpO2 98%   BMI 26.16 kg/m  General appearance: alert, cooperative and no distress Head: Normocephalic, without obvious abnormality, atraumatic Eyes: conjunctivae/corneas clear. PERRL, EOM's intact. Fundi benign. Ears: normal TM's and external ear canals both ears Nose: Nares normal. Septum midline. Mucosa normal. No drainage or sinus tenderness. Throat: lips, mucosa, and tongue normal; teeth and gums normal Neck: no adenopathy, no carotid bruit, no JVD, supple, symmetrical, trachea midline and thyroid not enlarged, symmetric, no  tenderness/mass/nodules Lungs: clear to auscultation bilaterally Heart: regular rate and rhythm, S1, S2 normal, no murmur, click, rub or gallop Abdomen:gravid, soft Pelvic: deferred and pt self swab obtained. Extremities: extremities normal, atraumatic, no cyanosis or edema Pulses: 2+ and symmetric Skin: Skin color, texture, turgor normal. No rashes or lesions Lymph nodes: Cervical, supraclavicular, and axillary nodes normal. Neurologic: Alert and oriented X 3, normal strength and tone. Normal symmetric reflexes. Normal coordination and gait    Assessment:    Healthy female exam G1P0 at  Plan:     Anticipatory guidance given including wearing seatbelts, smoke detectors in the home, increasing physical activity, increasing p.o. intake of water and vegetables. -pt had recent labs with OB/Gyn -given handout -next CPE in 1 yr See After Visit Summary for Counseling Recommendations    [redacted] weeks gestation of pregnancy -UA with 2+ leuks, 1+ protein, pH 7.5, SG 1.015 -We will send for culture. -If needed will send in ABX for UTI such as Macrobid 100 mg twice daily x7-10 days, ampicillin 500 mg 4 times daily 7-10 days, or Keflex 500 mg 4 times daily 7-10 days. -Continue follow-up with OB/GYN in [place], [place] -Patient given a list of area OB/GYN providers - Plan: POCT urinalysis dipstick  Vaginal discharge  - Plan: POCT urinalysis dipstick, Cervicovaginal ancillary only  F/u prn  [place], MD

## 2020-01-06 NOTE — Patient Instructions (Addendum)
[facility] -Jaymes Graff, MD  -Hoover Browns, MD - Osborn Coho, MD -Marline Backbone, MD ---Dierdre Forth, MD is listed on their site, but I'm pretty sure she retired.  [facility] -[place] -Gerald Leitz, MD  -Steva Ready, DO    Preventive Care 39-28 Years Old, Female Preventive care refers to visits with your health care provider and lifestyle choices that can promote health and wellness. This includes:  A yearly physical exam. This may also be called an annual well check.  Regular dental visits and eye exams.  Immunizations.  Screening for certain conditions.  Healthy lifestyle choices, such as eating a healthy diet, getting regular exercise, not using drugs or products that contain nicotine and tobacco, and limiting alcohol use. What can I expect for my preventive care visit? Physical exam Your health care provider will check your:  Height and weight. This may be used to calculate body mass index (BMI), which tells if you are at a healthy weight.  Heart rate and blood pressure.  Skin for abnormal spots. Counseling Your health care provider may ask you questions about your:  Alcohol, tobacco, and drug use.  Emotional well-being.  Home and relationship well-being.  Sexual activity.  Eating habits.  Work and work Astronomer.  Method of birth control.  Menstrual cycle.  Pregnancy history. What immunizations do I need?  Influenza (flu) vaccine  This is recommended every year. Tetanus, diphtheria, and pertussis (Tdap) vaccine  You may need a Td booster every 10 years. Varicella (chickenpox) vaccine  You may need this if you have not been vaccinated. Human papillomavirus (HPV) vaccine  If recommended by your health care provider, you may need three doses over 6 months. Measles, mumps, and rubella (MMR) vaccine  You may need at least one dose of MMR. You may also need a second dose. Meningococcal  conjugate (MenACWY) vaccine  One dose is recommended if you are age 70-21 years and a first-year college student living in a residence hall, or if you have one of several medical conditions. You may also need additional booster doses. Pneumococcal conjugate (PCV13) vaccine  You may need this if you have certain conditions and were not previously vaccinated. Pneumococcal polysaccharide (PPSV23) vaccine  You may need one or two doses if you smoke cigarettes or if you have certain conditions. Hepatitis A vaccine  You may need this if you have certain conditions or if you travel or work in places where you may be exposed to hepatitis A. Hepatitis B vaccine  You may need this if you have certain conditions or if you travel or work in places where you may be exposed to hepatitis B. Haemophilus influenzae type b (Hib) vaccine  You may need this if you have certain conditions. You may receive vaccines as individual doses or as more than one vaccine together in one shot (combination vaccines). Talk with your health care provider about the risks and benefits of combination vaccines. What tests do I need?  Blood tests  Lipid and cholesterol levels. These may be checked every 5 years starting at age 79.  Hepatitis C test.  Hepatitis B test. Screening  Diabetes screening. This is done by checking your blood sugar (glucose) after you have not eaten for a while (fasting).  Sexually transmitted disease (STD) testing.  BRCA-related cancer screening. This may be done if you have a family history of breast, ovarian, tubal, or peritoneal cancers.  Pelvic exam and Pap test. This may be done  every 3 years starting at age 51. Starting at age 59, this may be done every 5 years if you have a Pap test in combination with an HPV test. Talk with your health care provider about your test results, treatment options, and if necessary, the need for more tests. Follow these instructions at home: Eating and  drinking   Eat a diet that includes fresh fruits and vegetables, whole grains, lean protein, and low-fat dairy.  Take vitamin and mineral supplements as recommended by your health care provider.  Do not drink alcohol if: ? Your health care provider tells you not to drink. ? You are pregnant, may be pregnant, or are planning to become pregnant.  If you drink alcohol: ? Limit how much you have to 0-1 drink a day. ? Be aware of how much alcohol is in your drink. In the [place] one drink equals one 12 oz bottle of beer (355 mL), one 5 oz glass of wine (148 mL), or one 1 oz glass of hard liquor (44 mL). Lifestyle  Take daily care of your teeth and gums.  Stay active. Exercise for at least 30 minutes on 5 or more days each week.  Do not use any products that contain nicotine or tobacco, such as cigarettes, e-cigarettes, and chewing tobacco. If you need help quitting, ask your health care provider.  If you are sexually active, practice safe sex. Use a condom or other form of birth control (contraception) in order to prevent pregnancy and STIs (sexually transmitted infections). If you plan to become pregnant, see your health care provider for a preconception visit. What's next?  Visit your health care provider once a year for a well check visit.  Ask your health care provider how often you should have your eyes and teeth checked.  Stay up to date on all vaccines. This information is not intended to replace advice given to you by your health care provider. Make sure you discuss any questions you have with your health care provider. Document Revised: 01/01/2018 Document Reviewed: 01/01/2018 Elsevier Patient Education  2020 [employer].  Tests and Screening During Pregnancy Having certain tests and screenings during pregnancy is an important part of your prenatal care. These tests help your health care provider find problems that might affect your pregnancy. Some tests are done for all  pregnant women, and some are optional. Most of the tests and screenings do not pose any risks for you or your baby. You may need additional testing if any routine tests indicate a problem. Tests and screenings done in early pregnancy Some tests and screenings you can expect to have in early pregnancy include:  Blood tests, such as: ? Complete blood count (CBC). This test is done to check your red and white blood cells. It can help identify a risk for anemia, infection, or bleeding. ? Blood typing. This test determines your blood type as well as whether you have a certain protein in your red blood cells (Rh factor). If you do not have this protein (Rh negative) and your baby does have it (Rh positive), your body could make antibodies to the Rh factor. This could be dangerous to your baby's health. ? Tests to check for diseases that can cause birth defects or can be passed to your baby, such as:  [place] measles (rubella). The test indicates whether you are immune to rubella.  Hepatitis B and C. All women are tested for hepatitis B. You may also be tested for hepatitis C if  you have risk factors for the condition.  Zika virus infection. You may have a blood or urine test to check for this infection if you or your partner has traveled to an area where the virus occurs.  Urine testing. A urine sample can be tested for diabetes, protein in your urine, and signs of infection.  Testing for sexually transmitted infections (STIs), such as HIV, syphilis, and chlamydia.  Testing for tuberculosis. You may have this skin test if you are at risk for tuberculosis.  Fetal ultrasound. This is an imaging study of your developing baby. It is done using sound waves and a computer. This test may be done at 11-14 weeks to confirm your pregnancy and help determine your due date. Tests and screenings done later in pregnancy Certain tests are done for the first time during later pregnancy. In addition, some of the  tests that were done in early pregnancy are repeated at this time. Some common tests you can expect to have later in pregnancy include:  Rh antibody testing. If you are Rh negative, you will have a blood test at about 28 weeks of pregnancy to see if you are producing Rh antibodies. If you have not started to make antibodies, you will be given an injection to prevent you from making antibodies for the rest of your pregnancy.  Glucose screening. This tests your blood sugar to find out whether you are developing the type of diabetes that occurs during pregnancy (gestational diabetes). You may have this screening earlier if you have risk factors for diabetes.  Screening for group B streptococcus (GBS). GBS is a type of bacteria that may live in your rectum or vagina. You may have GBS without any symptoms. GBS can spread to your baby during birth. This test involves doing a rectal and vaginal swab at 35-37 weeks of pregnancy. If testing is positive for GBS, you may be treated with antibiotic medicine.  CBC to check for anemia and blood-clotting ability.  Urine tests to check for protein, which can be a sign of a condition called preeclampsia.  Fetal ultrasound. This may be repeated at 16-20 weeks to check how your baby is growing and developing. Screening for birth defects Some birth defects are caused by abnormal genes passed down through families. Early in your pregnancy, tests can be done to find out if your baby is at risk for a genetic disorder. This testing is optional. The type of testing recommended for you will depend on your family and medical history, your ethnicity, and your age. Testing may include:  Screening tests. These tests may include an ultrasound, blood tests, or a combination of both. The blood tests are used to check for abnormal genes, and the ultrasound is done to look for early birth defects.  Carrier screening. This test involves checking the blood or saliva of both parents to  see if they carry abnormal genes that could be passed down to a baby. If genetic screening shows that your baby is at risk for a genetic defect, additional diagnostic testing may be recommended, such as:  Amniocentesis. This involves testing a sample of fluid from your womb (amniotic fluid).  Chorionic villus sampling. In this test, a sample of cells from your placenta is checked for abnormal cells. Unlike other tests done during pregnancy, diagnostic testing does have some risk for your pregnancy. Talk to your health care provider about the risks and benefits of genetic testing. Where to find more information  American Pregnancy Association: americanpregnancy.org/prenatal-testing  Office on Women's Health: KeywordPortfolios.com.br  March of Dimes: marchofdimes.org/pregnancy Questions to ask your health care provider  What routine tests are recommended for me?  When and how will these tests be done?  When will I get the results of routine tests?  What do the results of these tests mean for me or my baby?  Do you recommend any genetic screening tests? Which ones?  Should I see a genetic counselor before having genetic screening? Summary  Having tests and screenings during pregnancy is an important part of your prenatal care.  In early pregnancy, testing may be done to check blood type, Rh status, and risks for various conditions that can affect your baby.  Fetal ultrasound may be done in early pregnancy to confirm a pregnancy and later to look for any birth defects.  Later in pregnancy, tests may include screening for GBS and gestational diabetes.  Genetic testing is optional. Consider talking to a genetic counselor about this testing. This information is not intended to replace advice given to you by your health care provider. Make sure you discuss any questions you have with your health care provider. Document Revised: 08/12/2018 Document Reviewed: 07/07/2017 Elsevier  Patient Education  [place].

## 2020-01-07 LAB — CERVICOVAGINAL ANCILLARY ONLY
Bacterial Vaginitis (gardnerella): POSITIVE — AB
Candida Glabrata: NEGATIVE
Candida Vaginitis: POSITIVE — AB
Chlamydia: NEGATIVE
Comment: NEGATIVE
Comment: NEGATIVE
Comment: NEGATIVE
Comment: NEGATIVE
Comment: NEGATIVE
Comment: NORMAL
Neisseria Gonorrhea: NEGATIVE
Trichomonas: NEGATIVE

## 2020-01-10 ENCOUNTER — Other Ambulatory Visit: Payer: Self-pay | Admitting: Family Medicine

## 2020-01-10 DIAGNOSIS — N760 Acute vaginitis: Secondary | ICD-10-CM

## 2020-01-10 DIAGNOSIS — B9689 Other specified bacterial agents as the cause of diseases classified elsewhere: Secondary | ICD-10-CM

## 2020-01-10 DIAGNOSIS — B3731 Acute candidiasis of vulva and vagina: Secondary | ICD-10-CM

## 2020-01-10 DIAGNOSIS — B373 Candidiasis of vulva and vagina: Secondary | ICD-10-CM

## 2020-01-10 MED ORDER — METRONIDAZOLE 500 MG PO TABS: 500.0000 mg | ORAL_TABLET | Freq: Two times a day (BID) | ORAL | 0 refills | Status: AC

## 2020-01-10 MED ORDER — CLOTRIMAZOLE 1 % EX CREA: 1.0000 "application " | TOPICAL_CREAM | Freq: Two times a day (BID) | CUTANEOUS | 0 refills | Status: DC

## 2020-01-12 ENCOUNTER — Encounter: Payer: Self-pay | Admitting: Family Medicine

## 2020-01-20 ENCOUNTER — Telehealth: Payer: Self-pay | Admitting: Family Medicine

## 2020-01-20 NOTE — Telephone Encounter (Signed)
Pt needs a call back about the px Metronidazole-it is not working and would like an alternative   Pt can be reached at [phone]

## 2020-01-21 NOTE — Telephone Encounter (Signed)
Spoke with pt advised to call her OBGYN for this issue since pt is [redacted] weeks pregnant and Dr Salomon Fick is out of the office, pt verbalized understanding

## 2020-03-03 LAB — URINE CULTURE

## 2020-03-03 LAB — C. TRACHOMATIS/N. GONORRHOEAE RNA
C. trachomatis RNA, TMA: NOT DETECTED
N. gonorrhoeae RNA, TMA: NOT DETECTED

## 2020-03-03 LAB — HOUSE ACCOUNT TRACKING

## 2020-04-19 ENCOUNTER — Ambulatory Visit (HOSPITAL_COMMUNITY)
Admission: EM | Admit: 2020-04-19 | Discharge: 2020-04-19 | Disposition: A | Discharge status: Home / Self Care | Payer: BLUE CROSS/BLUE SHIELD | Attending: Family Medicine | Admitting: Family Medicine

## 2020-04-19 ENCOUNTER — Encounter (HOSPITAL_COMMUNITY): Payer: Self-pay | Admitting: Emergency Medicine

## 2020-04-19 DIAGNOSIS — N898 Other specified noninflammatory disorders of vagina: Secondary | ICD-10-CM | POA: Diagnosis not present

## 2020-04-19 MED ORDER — METRONIDAZOLE 500 MG PO TABS: 500.0000 mg | ORAL_TABLET | Freq: Two times a day (BID) | ORAL | 0 refills | Status: DC

## 2020-04-19 NOTE — ED Triage Notes (Signed)
Pt c/o vaginal discharge and irritation x 2 days. Pt denies abd pain, cramping, or any urinary problems.

## 2020-04-19 NOTE — Discharge Instructions (Addendum)
We have sent testing for sexually transmitted infections. We will notify you of any positive results once they are received. If required, we will prescribe any medications you might need.  Please refrain from all sexual activity for at least the next seven days.  

## 2020-04-20 ENCOUNTER — Ambulatory Visit: Payer: BLUE CROSS/BLUE SHIELD | Admitting: Family Medicine

## 2020-04-21 ENCOUNTER — Telehealth (HOSPITAL_COMMUNITY): Payer: Self-pay | Admitting: Emergency Medicine

## 2020-04-21 LAB — CERVICOVAGINAL ANCILLARY ONLY
Bacterial Vaginitis (gardnerella): POSITIVE — AB
Candida Glabrata: NEGATIVE
Candida Vaginitis: POSITIVE — AB
Chlamydia: NEGATIVE
Comment: NEGATIVE
Comment: NEGATIVE
Comment: NEGATIVE
Comment: NEGATIVE
Comment: NEGATIVE
Comment: NORMAL
Neisseria Gonorrhea: NEGATIVE
Trichomonas: NEGATIVE

## 2020-04-21 MED ORDER — FLUCONAZOLE 150 MG PO TABS: 150.0000 mg | ORAL_TABLET | Freq: Once | ORAL | 0 refills | Status: AC

## 2020-04-22 NOTE — ED Provider Notes (Signed)
[facility]   [id number] 04/19/20 Arrival Time: 1933  ASSESSMENT & PLAN:  1. Vaginal discharge    No empiric tx.   Discharge Instructions     We have sent testing for sexually transmitted infections. We will notify you of any positive results once they are received. If required, we will prescribe any medications you might need.  Please refrain from all sexual activity for at least the next seven days.      Without s/s of PID.  Labs Reviewed  CERVICOVAGINAL ANCILLARY ONLY     Will notify of any positive results. Instructed to refrain from sexual activity for at least seven days.  Reviewed expectations re: course of current medical issues. Questions answered. Outlined signs and symptoms indicating need for more acute intervention. Patient verbalized understanding. After Visit Summary given.   SUBJECTIVE:  Kathy Cain is a 29 y.o. female who presents with complaint of vaginal discharge. Onset gradual. First noticed 2 d ago. No specific aggravating or alleviating factors reported. Denies: urinary frequency, dysuria and gross hematuria. Afebrile. No abdominal or pelvic pain. Normal PO intake wihout n/v. No genital rashes or lesions. Reports that she is sexually active. OTC treatment: none.  Patient's last menstrual period was 08/13/2019 (exact date).   OBJECTIVE:  Vitals:   04/19/20 2004  BP: 117/75  Pulse: 61  Resp: 16  Temp: 98.3 F (36.8 C)  TempSrc: Oral  SpO2: 100%     General appearance: alert, cooperative, appears stated age and no distress Lungs: unlabored respirations; speaks full sentences without difficulty Back: no CVA tenderness; FROM at waist Abdomen: soft, non-tender GU: deferred Skin: warm and dry Psychological: alert and cooperative; normal mood and affect.  Results for orders placed or performed during the hospital encounter of 04/19/20  Cervicovaginal ancillary only  Result Value Ref Range   Bacterial Vaginitis  (gardnerella) Positive (A)    Chlamydia Negative    Neisseria Gonorrhea Negative    Candida Vaginitis Positive (A)    Candida Glabrata Negative    Trichomonas Negative    Comment      Normal Reference Range Bacterial Vaginosis - Negative   Comment Normal Reference [place] Chlamydia - Negative    Comment      Normal Reference Range Neisseria Gonorrhea - Negative   Comment Normal Reference Range Candida Species - Negative    Comment Normal Reference Range Candida Galbrata - Negative    Comment Normal Reference Range Trichomonas - Negative     Labs Reviewed  CERVICOVAGINAL ANCILLARY ONLY - Abnormal; Notable for the following components:      Result Value   Bacterial Vaginitis (gardnerella) Positive (*)    Candida Vaginitis Positive (*)    All other components within normal limits    No Known Allergies  Past Medical History:  Diagnosis Date  . Migraines    Family History  Problem Relation Age of Onset  . Healthy Mother   . Healthy Father    Social History   Socioeconomic History  . Marital status: Single    Spouse name: Not on file  . Number of children: 0  . Years of education: Not on file  . Highest education level: Some college, no degree  Occupational History  . Occupation: cone   Tobacco Use  . Smoking status: Never Smoker  . Smokeless tobacco: Never Used  Vaping Use  . Vaping Use: Never used  Substance and Sexual Activity  . Alcohol use: Not Currently    Comment: ocass.   Marland Kitchen  Drug use: Never  . Sexual activity: Yes    Birth control/protection: None    Comment: depo x 11 yrs. stopped depo 02/2019  Other Topics Concern  . Not on file  Social History Narrative  . Not on file   Social Determinants of Health   Financial Resource Strain: Not on file  Food Insecurity: Not on file  Transportation Needs: Not on file  Physical Activity: Not on file  Stress: Not on file  Social Connections: Not on file  Intimate Partner Violence: Not At Risk  . Fear of  Current or Ex-Partner: No  . Emotionally Abused: No  . Physically Abused: No  . Sexually Abused: No          Mardella Layman, MD 04/22/20 1020

## 2020-04-28 ENCOUNTER — Other Ambulatory Visit: Payer: Self-pay

## 2020-04-28 ENCOUNTER — Emergency Department (HOSPITAL_COMMUNITY)
Admission: EM | Admit: 2020-04-28 | Discharge: 2020-04-28 | Disposition: A | Discharge status: Home / Self Care | Payer: BLUE CROSS/BLUE SHIELD | Attending: Emergency Medicine | Admitting: Emergency Medicine

## 2020-04-28 DIAGNOSIS — Z5321 Procedure and treatment not carried out due to patient leaving prior to being seen by health care provider: Secondary | ICD-10-CM | POA: Insufficient documentation

## 2020-04-28 DIAGNOSIS — G43909 Migraine, unspecified, not intractable, without status migrainosus: Secondary | ICD-10-CM | POA: Insufficient documentation

## 2020-04-28 LAB — POC URINE PREG, ED: Preg Test, Ur: NEGATIVE

## 2020-04-28 MED ORDER — KETOROLAC TROMETHAMINE 60 MG/2ML IM SOLN
15.0000 mg | Freq: Once | INTRAMUSCULAR | Status: AC
  Administered 2020-04-28: 15 mg via INTRAMUSCULAR
  Filled 2020-04-28: qty 2

## 2020-04-28 NOTE — ED Triage Notes (Signed)
Pt presents to ED POV. Pt c/o migraine. Pt reports hx of same. Pt states that home meds are not helping. AAO x4.

## 2020-08-11 ENCOUNTER — Emergency Department (HOSPITAL_COMMUNITY)
Admission: EM | Admit: 2020-08-11 | Discharge: 2020-08-12 | Disposition: A | Discharge status: Home / Self Care | Payer: BLUE CROSS/BLUE SHIELD | Attending: Emergency Medicine | Admitting: Emergency Medicine

## 2020-08-11 ENCOUNTER — Emergency Department (HOSPITAL_COMMUNITY): Payer: BLUE CROSS/BLUE SHIELD

## 2020-08-11 ENCOUNTER — Other Ambulatory Visit: Payer: Self-pay

## 2020-08-11 ENCOUNTER — Encounter (HOSPITAL_COMMUNITY): Payer: Self-pay | Admitting: Emergency Medicine

## 2020-08-11 DIAGNOSIS — R102 Pelvic and perineal pain: Secondary | ICD-10-CM | POA: Insufficient documentation

## 2020-08-11 DIAGNOSIS — O26891 Other specified pregnancy related conditions, first trimester: Secondary | ICD-10-CM

## 2020-08-11 DIAGNOSIS — R1030 Lower abdominal pain, unspecified: Secondary | ICD-10-CM | POA: Insufficient documentation

## 2020-08-11 DIAGNOSIS — R110 Nausea: Secondary | ICD-10-CM | POA: Insufficient documentation

## 2020-08-11 DIAGNOSIS — Z3A01 Less than 8 weeks gestation of pregnancy: Secondary | ICD-10-CM | POA: Insufficient documentation

## 2020-08-11 LAB — CBC WITH DIFFERENTIAL/PLATELET
Abs Immature Granulocytes: 0.01 10*3/uL (ref 0.00–0.07)
Basophils Absolute: 0 10*3/uL (ref 0.0–0.1)
Basophils Relative: 0 %
Eosinophils Absolute: 0.1 10*3/uL (ref 0.0–0.5)
Eosinophils Relative: 1 %
HCT: 33.5 % — ABNORMAL LOW (ref 36.0–46.0)
Hemoglobin: 10.7 g/dL — ABNORMAL LOW (ref 12.0–15.0)
Immature Granulocytes: 0 %
Lymphocytes Relative: 28 %
Lymphs Abs: 1.9 10*3/uL (ref 0.7–4.0)
MCH: 27.1 pg (ref 26.0–34.0)
MCHC: 31.9 g/dL (ref 30.0–36.0)
MCV: 84.8 fL (ref 80.0–100.0)
Monocytes Absolute: 0.4 10*3/uL (ref 0.1–1.0)
Monocytes Relative: 6 %
Neutro Abs: 4.5 10*3/uL (ref 1.7–7.7)
Neutrophils Relative %: 65 %
Platelets: 340 10*3/uL (ref 150–400)
RBC: 3.95 MIL/uL (ref 3.87–5.11)
RDW: 13.5 % (ref 11.5–15.5)
WBC: 6.9 10*3/uL (ref 4.0–10.5)
nRBC: 0 % (ref 0.0–0.2)

## 2020-08-11 LAB — COMPREHENSIVE METABOLIC PANEL
ALT: 15 U/L (ref 0–44)
AST: 18 U/L (ref 15–41)
Albumin: 4 g/dL (ref 3.5–5.0)
Alkaline Phosphatase: 57 U/L (ref 38–126)
Anion gap: 6 (ref 5–15)
BUN: 17 mg/dL (ref 6–20)
CO2: 25 mmol/L (ref 22–32)
Calcium: 8.4 mg/dL — ABNORMAL LOW (ref 8.9–10.3)
Chloride: 105 mmol/L (ref 98–111)
Creatinine, Ser: 0.82 mg/dL (ref 0.44–1.00)
GFR, Estimated: >60 mL/min (ref >60)
Glucose, Bld: 92 mg/dL (ref 70–99)
Potassium: 3.5 mmol/L (ref 3.5–5.1)
Sodium: 136 mmol/L (ref 135–145)
Total Bilirubin: 0.6 mg/dL (ref 0.3–1.2)
Total Protein: 7.3 g/dL (ref 6.5–8.1)

## 2020-08-11 LAB — HCG, QUANTITATIVE, PREGNANCY: hCG, Beta Chain, Quant, S: 30479 m[IU]/mL — ABNORMAL HIGH (ref <5)

## 2020-08-11 LAB — WET PREP, GENITAL
Sperm: NONE SEEN
Trich, Wet Prep: NONE SEEN
Yeast Wet Prep HPF POC: NONE SEEN

## 2020-08-11 LAB — LIPASE, BLOOD: Lipase: 57 U/L — ABNORMAL HIGH (ref 11–51)

## 2020-08-11 LAB — I-STAT BETA HCG BLOOD, ED (MC, WL, AP ONLY): I-stat hCG, quantitative: >2000 m[IU]/mL — ABNORMAL HIGH (ref <5)

## 2020-08-11 MED ORDER — SODIUM CHLORIDE 0.9 % IV BOLUS
1000.0000 mL | Freq: Once | INTRAVENOUS | Status: AC
  Administered 2020-08-11: 1000 mL via INTRAVENOUS

## 2020-08-11 NOTE — ED Provider Notes (Signed)
[facility]-EMERGENCY DEPT Provider Note   CSN: [id number] Arrival date & time: 08/11/20  1949     History Chief Complaint  Patient presents with  . Abdominal Pain    Kathy Cain is a 30 y.o. female G2, P0 [redacted]W[redacted]D who presents today for evaluation of pelvic pain and cramping. She has not had any ultrasounds so far this pregnancy and had an appointment for this to be established on Monday. She states that she had multiple positive pregnancy tests at home. She states that she had about 45 minutes of lower abdominal pain, cramping with nausea.  Pain was worse on the left than on the right.  She denies any diarrhea or vomiting.  No vaginal spotting or bleeding. She is concerned because her previous pregnancy ended in October when she, per her report at 7 months pregnant had a miscarriage.  She denies any recent trauma.  No dysuria, frequency or urgency.    HPI     Past Medical History:  Diagnosis Date  . Migraines     There are no problems to display for this patient.   Past Surgical History:  Procedure Laterality Date  . none       OB History    Gravida  2   Para  0   Term  0   Preterm  0   AB  0   Living  0     SAB  0   IAB  0   Ectopic  0   Multiple  0   Live Births              Family History  Problem Relation Age of Onset  . Healthy Mother   . Healthy Father     Social History   Tobacco Use  . Smoking status: Never Smoker  . Smokeless tobacco: Never Used  Vaping Use  . Vaping Use: Never used  Substance Use Topics  . Alcohol use: Not Currently    Comment: ocass.   . Drug use: Never    Home Medications Prior to Admission medications   Medication Sig Start Date End Date Taking? Authorizing Provider  Prenatal Vit-Fe Fumarate-FA (PRENATAL PO) Take 1 tablet by mouth daily.   Yes [provider]  metroNIDAZOLE (FLAGYL) 500 MG tablet Take 1 tablet (500 mg total) by mouth 2 (two) times daily. 08/12/20    Cristina Gong, PA-C    Allergies    Patient has no known allergies.  Review of Systems   Review of Systems  Constitutional: Negative for chills and fever.  HENT: Negative for congestion.   Eyes: Negative for visual disturbance.  Gastrointestinal: Positive for abdominal pain and nausea. Negative for constipation, diarrhea and vomiting.  Genitourinary: Positive for pelvic pain. Negative for vaginal bleeding and vaginal discharge.  Musculoskeletal: Negative for back pain.  Skin: Negative for color change and rash.  Neurological: Negative for headaches.  Psychiatric/Behavioral: Negative for confusion.  All other systems reviewed and are negative.   Physical Exam Updated Vital Signs BP 136/78 (BP Location: Right Arm)   Pulse 81   Temp 98.3 F (36.8 C) (Oral)   Resp 16   Ht 5\' 2"  (1.575 m)   Wt 63.5 kg   LMP 06/25/2020 (Approximate)   SpO2 100%   BMI 25.61 kg/m   Physical Exam Vitals and nursing note reviewed. Exam conducted with a chaperone present (Female RN).  Constitutional:      General: She is not in acute distress.  Appearance: She is not diaphoretic.  HENT:     Head: Normocephalic and atraumatic.  Eyes:     General: No scleral icterus.       Right eye: No discharge.        Left eye: No discharge.     Conjunctiva/sclera: Conjunctivae normal.  Cardiovascular:     Rate and Rhythm: Normal rate and regular rhythm.  Pulmonary:     Effort: Pulmonary effort is normal. No respiratory distress.     Breath sounds: No stridor.  Abdominal:     General: Bowel sounds are normal. There is no distension.     Palpations: Abdomen is soft.     Tenderness: There is abdominal tenderness in the right lower quadrant, suprapubic area and left lower quadrant.     Hernia: No hernia is present.  Genitourinary:    Vagina: Normal.     Cervix: No cervical motion tenderness.     Uterus: Not tender.      Adnexa:        Right: No mass, tenderness or fullness.         Left: No  mass, tenderness or fullness.    Musculoskeletal:        General: No deformity.     Cervical back: Normal range of motion.  Skin:    General: Skin is warm and dry.  Neurological:     General: No focal deficit present.     Mental Status: She is alert.     Motor: No abnormal muscle tone.  Psychiatric:        Behavior: Behavior normal.     ED Results / Procedures / Treatments   Labs (all labs ordered are listed, but only abnormal results are displayed) Labs Reviewed  WET PREP, GENITAL - Abnormal; Notable for the following components:      Result Value   Clue Cells Wet Prep HPF POC PRESENT (*)    WBC, Wet Prep HPF POC RARE (*)    All other components within normal limits  LIPASE, BLOOD - Abnormal; Notable for the following components:   Lipase 57 (*)    All other components within normal limits  COMPREHENSIVE METABOLIC PANEL - Abnormal; Notable for the following components:   Calcium 8.4 (*)    All other components within normal limits  CBC WITH DIFFERENTIAL/PLATELET - Abnormal; Notable for the following components:   Hemoglobin 10.7 (*)    HCT 33.5 (*)    All other components within normal limits  HCG, QUANTITATIVE, PREGNANCY - Abnormal; Notable for the following components:   hCG, Beta Chain, Quant, S 30,479 (*)    All other components within normal limits  I-STAT BETA HCG BLOOD, ED ([facility], [facility], [facility] ONLY) - Abnormal; Notable for the following components:   I-stat hCG, quantitative >2,000.0 (*)    All other components within normal limits  URINALYSIS, ROUTINE W REFLEX MICROSCOPIC  ABO/RH  GC/CHLAMYDIA PROBE AMP ([place]) NOT AT [facility]    EKG None  Radiology US OB Comp < 14 Wks  Result Date: 08/11/2020 CLINICAL DATA:  Pregnant patient in first-trimester pregnancy with abdominal cramping. Gestational age [redacted] weeks 2 days based on LMP. EXAM: OBSTETRIC <14 WK [place] AND TRANSVAGINAL OB US TECHNIQUE: Both transabdominal and transvaginal ultrasound examinations were performed for  complete evaluation of the gestation as well as the maternal uterus, adnexal regions, and pelvic cul-de-sac. Transvaginal technique was performed to assess early pregnancy. COMPARISON:  None. FINDINGS: Intrauterine gestational sac: Single. Yolk sac:  Visualized. Embryo:  Visualized. Cardiac Activity: Visualized. Heart Rate: 82 bpm CRL:  3.9 mm   6 w   0 d                  US EDC: 04/06/2021 Subchorionic hemorrhage: Small to moderate measuring 3.4 x 0.8 x 1.1 cm. Maternal uterus/adnexae: Right ovary is normal measuring 3.2 x 2.3 x 1.7 cm. Blood flow is noted. The left ovary is normal measuring 3.4 x 1.8 x 2.1 cm and contains a corpus luteal cyst. Blood flow is noted. No free fluid or adnexal mass. IMPRESSION: 1. Single live intrauterine pregnancy estimated gestational age [redacted] weeks 0 days based on crown-rump length. Fetal bradycardia may be due to early gestational age. Recommend continued clinical follow-up. 2. Small to moderate subchorionic hemorrhage. 3. Physiologic corpus luteal cyst in the left ovary. Electronically Signed   By: Narda RutherfordMelanie  Sanford M.D.   On: 08/11/2020 23:00   US OB Transvaginal  Result Date: 08/11/2020 CLINICAL DATA:  Pregnant patient in first-trimester pregnancy with abdominal cramping. Gestational age [redacted] weeks 2 days based on LMP. EXAM: OBSTETRIC <14 WK US AND TRANSVAGINAL OB US TECHNIQUE: Both transabdominal and transvaginal ultrasound examinations were performed for complete evaluation of the gestation as well as the maternal uterus, adnexal regions, and pelvic cul-de-sac. Transvaginal technique was performed to assess early pregnancy. COMPARISON:  None. FINDINGS: Intrauterine gestational sac: Single. Yolk sac:  Visualized. Embryo:  Visualized. Cardiac Activity: Visualized. Heart Rate: 82 bpm CRL:  3.9 mm   6 w   0 d                  US EDC: 04/06/2021 Subchorionic hemorrhage: Small to moderate measuring 3.4 x 0.8 x 1.1 cm. Maternal uterus/adnexae: Right ovary is normal measuring 3.2 x 2.3 x  1.7 cm. Blood flow is noted. The left ovary is normal measuring 3.4 x 1.8 x 2.1 cm and contains a corpus luteal cyst. Blood flow is noted. No free fluid or adnexal mass. IMPRESSION: 1. Single live intrauterine pregnancy estimated gestational age [redacted] weeks 0 days based on crown-rump length. Fetal bradycardia may be due to early gestational age. Recommend continued clinical follow-up. 2. Small to moderate subchorionic hemorrhage. 3. Physiologic corpus luteal cyst in the left ovary. Electronically Signed   By: Narda RutherfordMelanie  Sanford M.D.   On: 08/11/2020 23:00    Procedures Procedures   Medications Ordered in ED Medications  sodium chloride 0.9 % bolus 1,000 mL (0 mLs Intravenous Stopped 08/11/20 2245)    ED Course  I have reviewed the triage vital signs and the nursing notes.  Pertinent labs & imaging results that were available during my care of the patient were reviewed by me and considered in my medical decision making (see chart for details).  Clinical Course as of 08/12/20 0006  Fri Aug 11, 2020  2136 Attempted to perform pelvic exam, patient is currently getting ultrasound. [EH]  2359 Spoke with patient, she wishes to go home at this time.  We discussed that I do not have her ABO Rh result for her UA back.  We discussed that based on her subchorionic hemorrhage have her blood type is negative she would require consideration for RhoGam.  We discussed possible consequences of not getting RhoGam. Additionally we discusse possible consequences of untreated asymptomatic bacteriuria in pregnancy.  She wishes to go at this time we will follow her results on MyChart.   [EH]    Clinical Course User Index [EH] Norman ClayHammond, Neytiri Asche W, PA-C   MDM  Rules/Calculators/A&P                         Patient is a 30 year old woman who presents today for evaluation of 45 minutes of crampy pelvic abdominal pain.  She is about [redacted] weeks pregnant by LMP.  On exam she has mild tenderness in bilateral lower abdomen and  midline.  Her pain is not worse on one side or the other.  She has not had any ultrasounds so far this pregnancy and her first OB/GYN visit is scheduled for 2 days from now. hCG is appropriately elevated at 30,000.  Lipase is slightly elevated however she is not having pain in this area and I doubt this is significant. CMP and CBC are unremarkable with the exception of mild anemia with a hemoglobin of 10.7. ABO Rh is ordered. Pelvic ultrasound was obtained showing a single intrauterine gestation measuring about 6 weeks 0 days which is consistent with her LMP. I discussed with patient subchorionic hemorrhage and mild bradycardia and possible implications of this. She felt much better and wished to go home. I did discuss with her that she is leaving before her ABO Rh results and that if it was showing she has a negative blood type there would be need for consideration of RhoGam given her subchorionic hemorrhage.  She states her understanding and wishes to still go home.  We discussed possible negative outcomes from not getting appropriate RhoGam therapy and she states her understanding of this.  Patient wet prep shows clue cells.  Given her history of prior miscarriage will treat for BV.  Return precautions were discussed with patient who states their understanding.  At the time of discharge patient denied any unaddressed complaints or concerns.  Patient is agreeable for discharge home.  Note: Portions of this report may have been transcribed using voice recognition software. Every effort was made to ensure accuracy; however, inadvertent computerized transcription errors may be present  Final Clinical Impression(s) / ED Diagnoses Final diagnoses:  Pelvic pain affecting pregnancy in first trimester, antepartum    Rx / DC Orders ED Discharge Orders         Ordered    metroNIDAZOLE (FLAGYL) 500 MG tablet  2 times daily        08/12/20 0003           Cristina Gong, PA-C 08/12/20  0011    Charlynne Pander, MD 08/12/20 [phone]

## 2020-08-11 NOTE — ED Triage Notes (Addendum)
Patient reports abdominal cramping with nausea for 45 minutes. Reports LMP at the end of February. OB appointment scheduled for Monday.

## 2020-08-12 LAB — URINALYSIS, ROUTINE W REFLEX MICROSCOPIC
Bilirubin Urine: NEGATIVE
Glucose, UA: NEGATIVE mg/dL
Hgb urine dipstick: NEGATIVE
Ketones, ur: NEGATIVE mg/dL
Leukocytes,Ua: NEGATIVE
Nitrite: NEGATIVE
Protein, ur: NEGATIVE mg/dL
Specific Gravity, Urine: 1.014 (ref 1.005–1.030)
pH: 7 (ref 5.0–8.0)

## 2020-08-12 LAB — ABO/RH: ABO/RH(D): B POS

## 2020-08-12 MED ORDER — METRONIDAZOLE 500 MG PO TABS: 500.0000 mg | ORAL_TABLET | Freq: Two times a day (BID) | ORAL | 0 refills | Status: DC

## 2020-08-12 NOTE — Discharge Instructions (Addendum)
Today you have a blood type test pending, along with test for gonorrhea, chlamydia (that we automatically obtained in the emergency room anytime we do a pelvic exam), along with a urine test.  Depending on what your blood type is if it is negative, you would require treatment possibly. Additionally we discussed risks of not having appropriate treatment if your blood type is negative with blood mixing between you and the baby, and your subchorionic hemorrhage. Additionally we discussed possible consequences of untreated bacteria in your urine and pregnancy. As we discussed if your symptoms worsen or you have concerns please go to the maternity assessment unit using entrance C at [facility].  I would recommend that you follow pelvic rest guidelines including no sexual contact until you follow-up with OB/GYN on Monday.

## 2020-08-14 LAB — GC/CHLAMYDIA PROBE AMP (~~LOC~~) NOT AT ARMC
Chlamydia: NEGATIVE
Comment: NEGATIVE
Comment: NORMAL
Neisseria Gonorrhea: NEGATIVE

## 2021-01-31 ENCOUNTER — Emergency Department (HOSPITAL_COMMUNITY)
Admission: EM | Admit: 2021-01-31 | Discharge: 2021-01-31 | Disposition: A | Discharge status: Home / Self Care | Payer: BLUE CROSS/BLUE SHIELD | Attending: Emergency Medicine | Admitting: Emergency Medicine

## 2021-01-31 ENCOUNTER — Encounter (HOSPITAL_COMMUNITY): Payer: Self-pay

## 2021-01-31 DIAGNOSIS — R072 Precordial pain: Secondary | ICD-10-CM | POA: Diagnosis not present

## 2021-01-31 DIAGNOSIS — Z3A31 31 weeks gestation of pregnancy: Secondary | ICD-10-CM | POA: Diagnosis not present

## 2021-01-31 DIAGNOSIS — O26893 Other specified pregnancy related conditions, third trimester: Secondary | ICD-10-CM | POA: Diagnosis present

## 2021-01-31 DIAGNOSIS — R0789 Other chest pain: Secondary | ICD-10-CM

## 2021-01-31 DIAGNOSIS — O99891 Other specified diseases and conditions complicating pregnancy: Secondary | ICD-10-CM

## 2021-01-31 NOTE — ED Triage Notes (Addendum)
Pt arrived via POV, states cont left sided chest pain since Saturday. Non radiating, non reproducible. Pain slightly relieved when she pushes below left breast.  Denies any back or abd pain.   States [redacted] wks pregnant, last felt baby move approx 30 mins PTA

## 2021-01-31 NOTE — ED Notes (Addendum)
OB Rapid Response nurse contacted

## 2021-01-31 NOTE — Progress Notes (Signed)
[facility] RN called by ED about a 32 week G2P0 patient that is complaining of chest pain. [facility] RN arrived to ED at 0515 and the ED RN had gotten fetal heart tones of 145 and provider had performed bedside ultrasound. The patient had already been cleared by the ED and was awaiting discharge.  The patient states there has been no problems with this pregnancy and has good fetal movement. Denies LOF or bleeding. She states she is anxious about this pregnancy because she had a stillborn last year. She receives prenatal care in [place], [place].  Dr. Jolayne Panther was notified about this patient at [phone] and given a full report. Dr. Jolayne Panther states she can be OB cleared since there was good fetal heart tones and there is no OB concerns.

## 2021-01-31 NOTE — ED Provider Notes (Signed)
[facility]-EMERGENCY DEPT Provider Note: Lowella Dell, MD, FACEP  CSN: [id number] MRN: [id number] ARRIVAL: 01/31/21 at 0416 ROOM: [facility]20/[facility]20   CHIEF COMPLAINT  Chest Pain   HISTORY OF PRESENT ILLNESS  01/31/21 4:53 AM Kathy Cain is a 30 y.o. female who is about [redacted] weeks pregnant.  She is here with chest pain since Saturday.  The pain is in the left lower parasternal area.  It does not radiate.  It is somewhat relieved when she pushes below the left breast.  She rates his pain as a 5 out of 10, describes it as a "bulging" pain and is somewhat worse with palpation but not with deep breathing or movement.  She is not short of breath.  She is not having swelling or pain in her legs.  Her oxygen saturation was noted to be 100% on room air.   Past Medical History:  Diagnosis Date   Migraines     Past Surgical History:  Procedure Laterality Date   none      Family History  Problem Relation Age of Onset   Healthy Mother    Healthy Father     Social History   Tobacco Use   Smoking status: Never   Smokeless tobacco: Never  Vaping Use   Vaping Use: Never used  Substance Use Topics   Alcohol use: Not Currently    Comment: ocass.    Drug use: Never    Prior to Admission medications   Medication Sig Start Date End Date Taking? Authorizing Provider  Prenatal Vit-Fe Fumarate-FA (PRENATAL PO) Take 1 tablet by mouth daily.    [provider]    Allergies Patient has no known allergies.   REVIEW OF SYSTEMS  Negative except as noted here or in the History of Present Illness.   PHYSICAL EXAMINATION  Initial Vital Signs Blood pressure 103/70, pulse 93, temperature 97.7 F (36.5 C), temperature source Oral, resp. rate 16, last menstrual period 06/25/2020, SpO2 100 %, unknown if currently breastfeeding.  Examination General: Well-developed, well-nourished female in no acute distress; appearance consistent with age of record HENT: normocephalic;  atraumatic Eyes: Normal appearance Neck: supple Heart: regular rate and rhythm Lungs: clear to auscultation bilaterally; mild left parasternal tenderness at costochondral junction Abdomen: soft; nontender; gravid, consistent with dates; FHTs 145; bowel sounds present Extremities: No deformity; full range of motion; pulses normal; no edema  Neurologic: Awake, alert and oriented; motor function intact in all extremities and symmetric; no facial droop Skin: Warm and dry Psychiatric: Normal mood and affect   RESULTS  Summary of this visit's results, reviewed and interpreted by myself:   EKG Interpretation  Date/Time:  Wednesday January 31 2021 04:22:49 EDT Ventricular Rate:  88 PR Interval:  156 QRS Duration: 85 QT Interval:  345 QTC Calculation: 418 R Axis:   21 Text Interpretation: Sinus rhythm Borderline T abnormalities, diffuse leads No previous ECGs available Confirmed by Paula Libra ([id number]) on 01/31/2021 4:40:01 AM       Laboratory Studies: No results found for this or any previous visit (from the past 24 hour(s)). Imaging Studies: No results found.  ED COURSE and MDM  Nursing notes, initial and subsequent vitals signs, including pulse oximetry, reviewed and interpreted by myself.  Vitals:   01/31/21 0424  BP: 103/70  Pulse: 93  Resp: 16  Temp: 97.7 F (36.5 C)  TempSrc: Oral  SpO2: 100%   Medications - No data to display  The patient's pain is most consistent with costochondritis.  I  do not believe radiographs would be beneficial at this time as the pain is superficial and somewhat reproducible.  The baby's heart tones were measured at 145 and the baby is active with her mother reporting frequent fetal movements.  PROCEDURES  Procedures   ED DIAGNOSES     ICD-10-CM   1. Chest pain during pregnancy  O99.891    R07.9          Ryann Pauli, Jonny Ruiz, MD 01/31/21 [phone]

## 2022-09-30 IMAGING — US US OB COMP LESS 14 WK
1 series · 13 of 28 positions shown · non-contrast
Comparison: None.

CLINICAL DATA: Pregnant patient in first-trimester pregnancy with
abdominal cramping. Gestational age 6 weeks 2 days based on LMP.

EXAM:
OBSTETRIC <14 WK US AND TRANSVAGINAL OB US
TECHNIQUE: Both transabdominal and transvaginal ultrasound examinations were
performed for complete evaluation of the gestation as well as the
maternal uterus, adnexal regions, and pelvic cul-de-sac.
Transvaginal technique was performed to assess early pregnancy.

[Series 1: us ob comp less 14 wk · 13 of 100 slices shown]
[im 4/100]
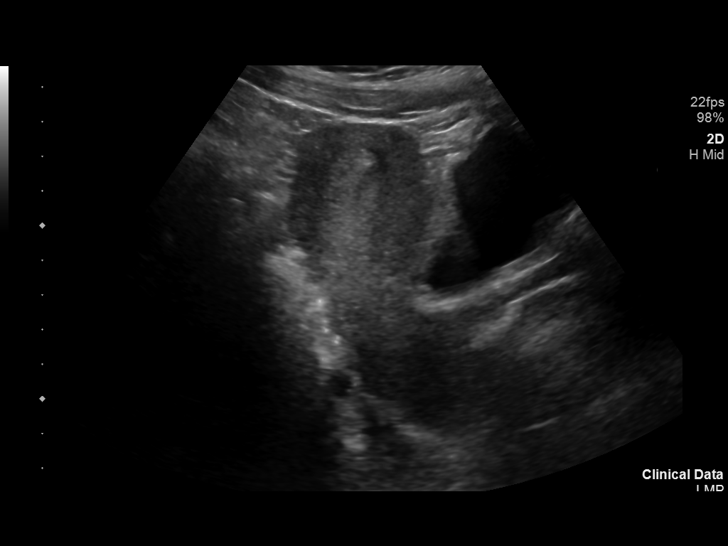
[im 12/100]
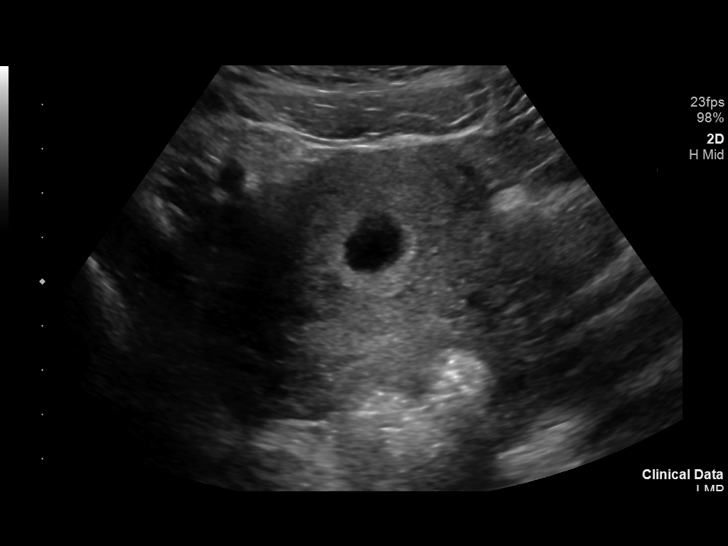
[im 19/100]
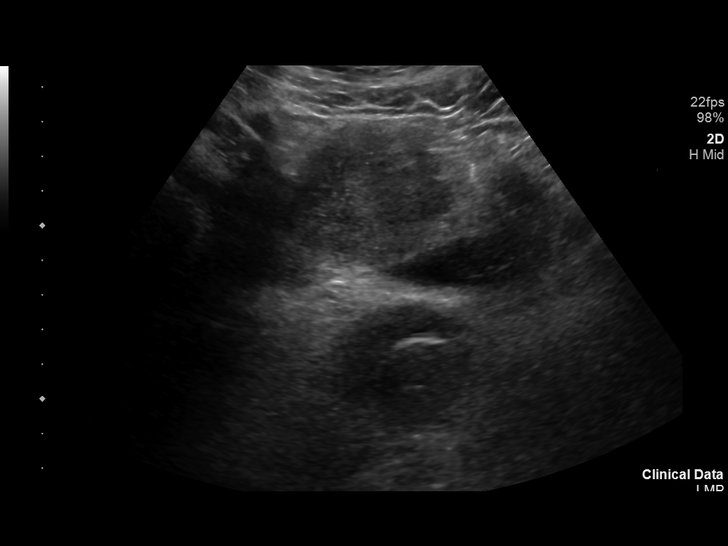
[im 26/100]
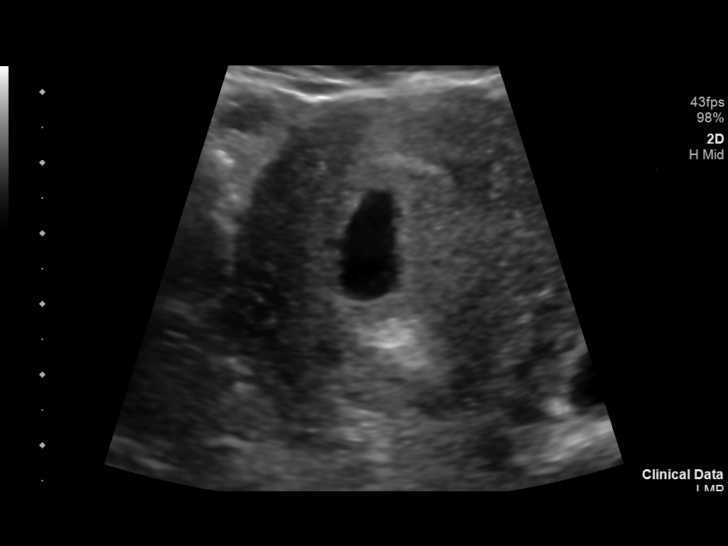
[im 34/100]
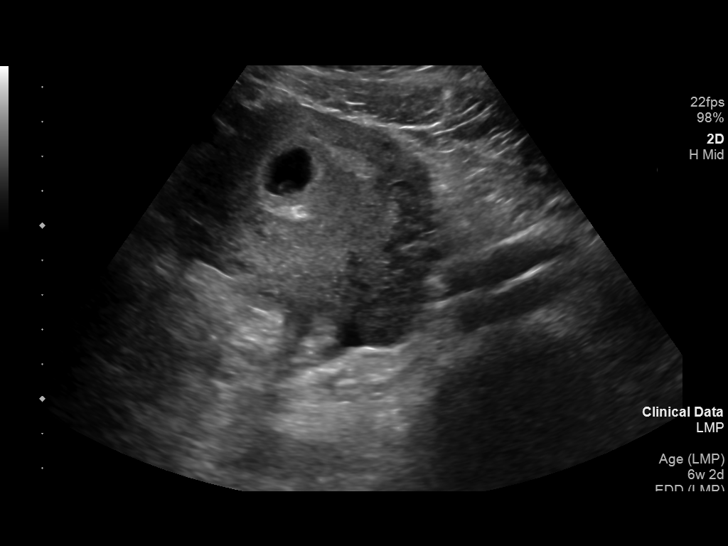
[im 41/100]
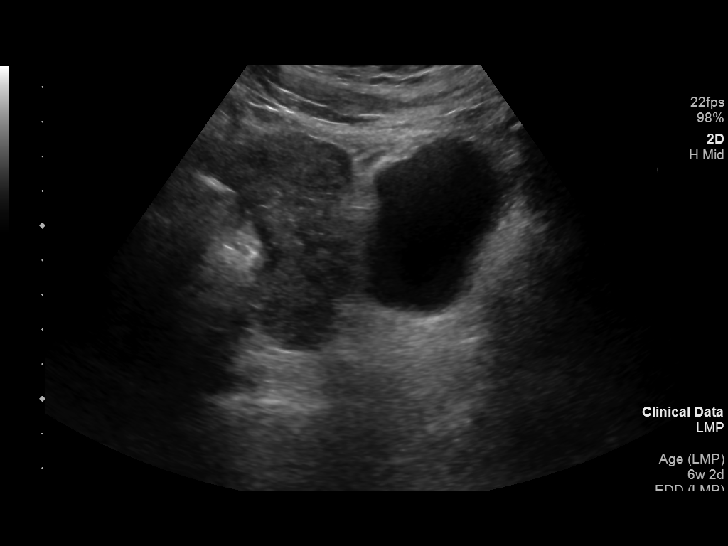
[im 52/100]
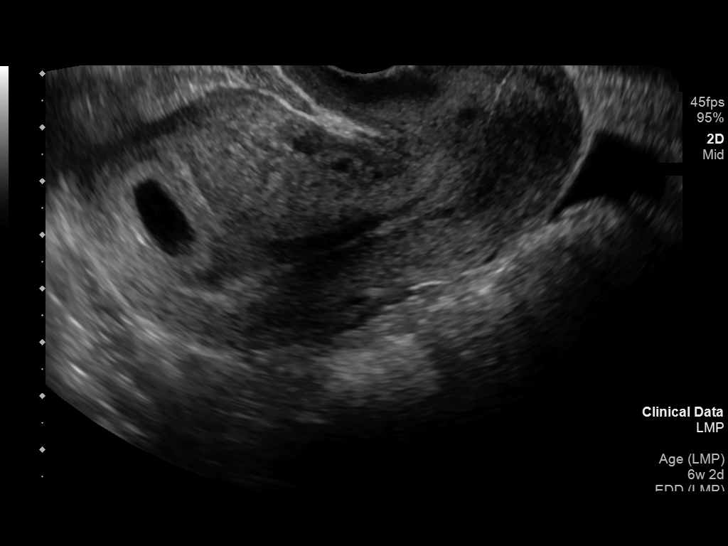
[im 59/100]
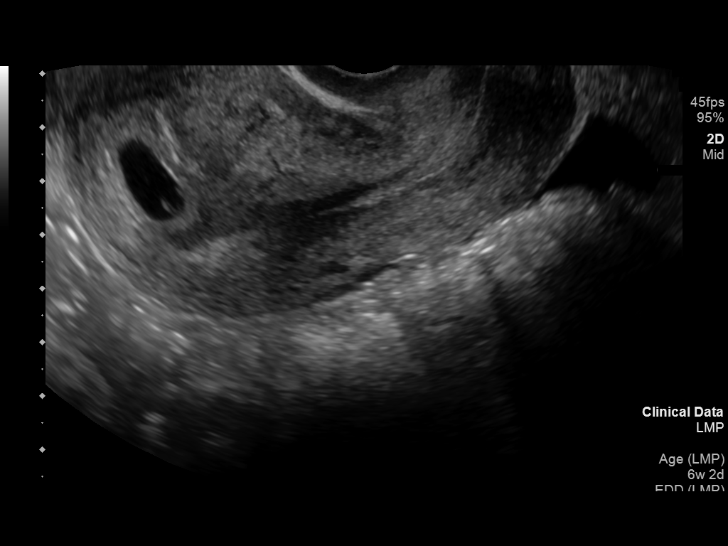
[im 67/100]
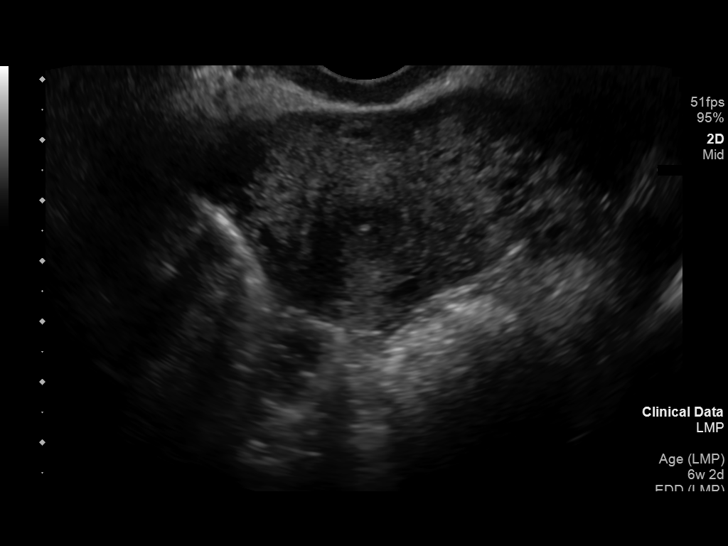
[im 74/100]
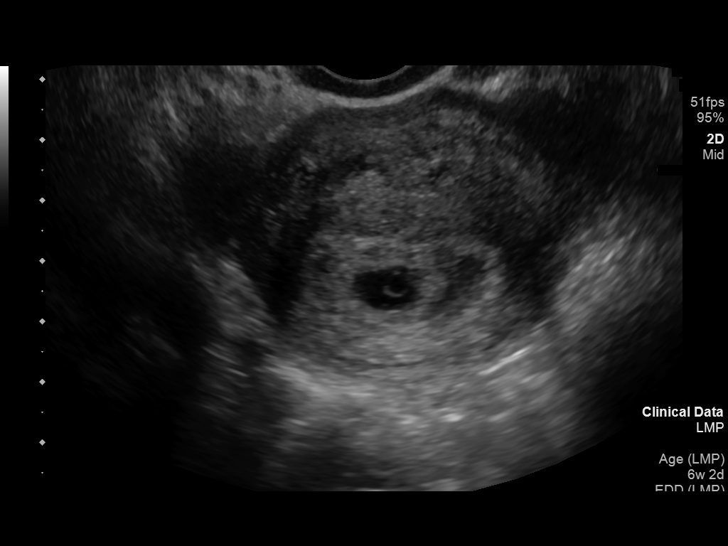
[im 81/100]
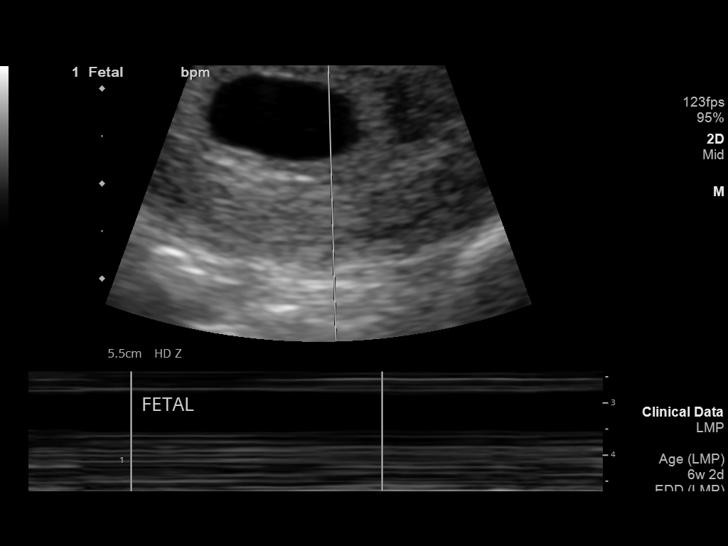
[im 89/100]
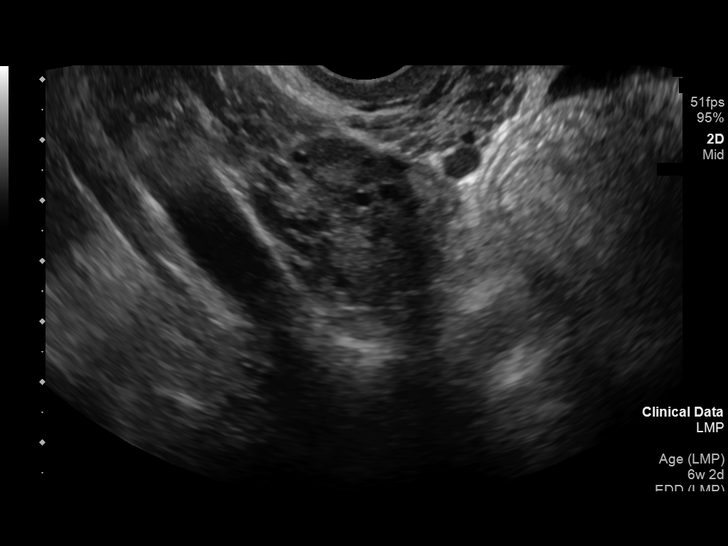
[im 96/100]
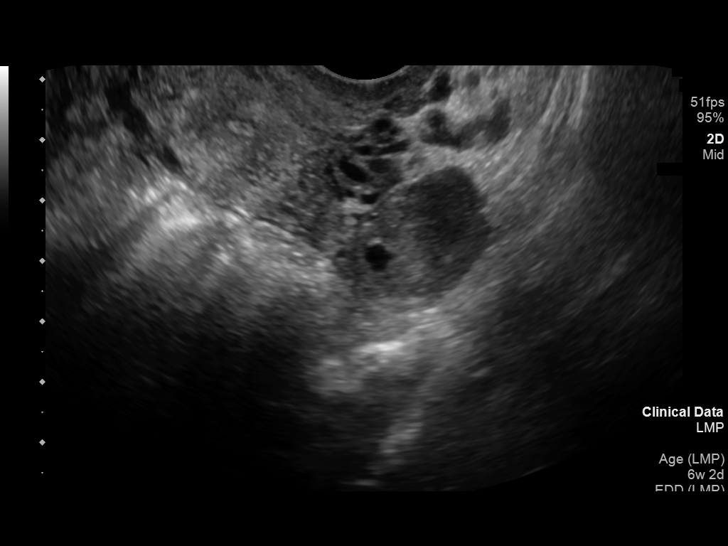

[13 of 28 positions shown; findings below may reference images not displayed]

FINDINGS: Intrauterine gestational sac: Single.

Yolk sac:  Visualized.

Embryo:  Visualized.

Cardiac Activity: Visualized.

Heart Rate: 82 bpm

CRL:  3.9 mm   6 w   0 d                  US EDC: 04/06/2021

Subchorionic hemorrhage: Small to moderate measuring 3.4 x 0.8 x
cm.

Maternal uterus/adnexae: Right ovary is normal measuring 3.2 x 2.3 x
1.7 cm. Blood flow is noted. The left ovary is normal measuring
x 1.8 x 2.1 cm and contains a corpus luteal cyst. Blood flow is
noted. No free fluid or adnexal mass.
IMPRESSION: 1. Single live intrauterine pregnancy estimated gestational age 6
weeks 0 days based on crown-rump length. Fetal bradycardia may be
due to early gestational age. Recommend continued clinical
follow-up.
2. Small to moderate subchorionic hemorrhage.
3. Physiologic corpus luteal cyst in the left ovary.
